# Patient Record
Sex: Male | Born: 1945 | Race: White | Hispanic: No | Marital: Married | Smoking: Former smoker
Health system: Southern US, Community
[De-identification: ages and names within clinical notes are randomized; demographics above are authoritative.]

## PROBLEM LIST (undated history)

## (undated) DIAGNOSIS — I499 Cardiac arrhythmia, unspecified: Secondary | ICD-10-CM

## (undated) DIAGNOSIS — N400 Enlarged prostate without lower urinary tract symptoms: Secondary | ICD-10-CM

## (undated) DIAGNOSIS — I10 Essential (primary) hypertension: Secondary | ICD-10-CM

## (undated) DIAGNOSIS — M069 Rheumatoid arthritis, unspecified: Secondary | ICD-10-CM

## (undated) DIAGNOSIS — K589 Irritable bowel syndrome without diarrhea: Secondary | ICD-10-CM

## (undated) DIAGNOSIS — Z8701 Personal history of pneumonia (recurrent): Secondary | ICD-10-CM

## (undated) DIAGNOSIS — K279 Peptic ulcer, site unspecified, unspecified as acute or chronic, without hemorrhage or perforation: Secondary | ICD-10-CM

## (undated) HISTORY — PX: APPENDECTOMY: SHX54

---

## 2002-02-10 ENCOUNTER — Encounter: Payer: Self-pay | Admitting: Orthopedic Surgery

## 2002-02-10 ENCOUNTER — Ambulatory Visit (HOSPITAL_COMMUNITY): Admission: RE | Admit: 2002-02-10 | Discharge: 2002-02-10 | Payer: Self-pay | Admitting: Orthopedic Surgery

## 2008-07-05 ENCOUNTER — Encounter: Admission: RE | Admit: 2008-07-05 | Discharge: 2008-07-05 | Payer: Self-pay | Admitting: Rheumatology

## 2009-07-21 HISTORY — PX: KNEE ARTHROSCOPY: SUR90

## 2010-04-02 ENCOUNTER — Encounter: Admission: RE | Admit: 2010-04-02 | Discharge: 2010-04-02 | Payer: Self-pay | Admitting: Orthopedic Surgery

## 2010-04-03 ENCOUNTER — Encounter: Admission: RE | Admit: 2010-04-03 | Discharge: 2010-04-03 | Payer: Self-pay | Admitting: Orthopedic Surgery

## 2010-06-20 ENCOUNTER — Ambulatory Visit (HOSPITAL_COMMUNITY)
Admission: RE | Admit: 2010-06-20 | Discharge: 2010-06-20 | Payer: Self-pay | Source: Home / Self Care | Admitting: Rheumatology

## 2010-07-21 HISTORY — PX: TOTAL KNEE ARTHROPLASTY: SHX125

## 2010-08-29 ENCOUNTER — Emergency Department (HOSPITAL_COMMUNITY): Payer: Commercial Managed Care - PPO

## 2010-08-29 ENCOUNTER — Observation Stay (HOSPITAL_COMMUNITY)
Admission: EM | Admit: 2010-08-29 | Discharge: 2010-08-30 | Disposition: A | Discharge status: Other Acute Inpatient Hospital | Payer: Commercial Managed Care - PPO | Attending: Internal Medicine | Admitting: Internal Medicine

## 2010-08-29 ENCOUNTER — Inpatient Hospital Stay (HOSPITAL_COMMUNITY): Payer: Commercial Managed Care - PPO

## 2010-08-29 DIAGNOSIS — Z79899 Other long term (current) drug therapy: Secondary | ICD-10-CM | POA: Insufficient documentation

## 2010-08-29 DIAGNOSIS — M25569 Pain in unspecified knee: Secondary | ICD-10-CM | POA: Insufficient documentation

## 2010-08-29 DIAGNOSIS — T43502A Poisoning by unspecified antipsychotics and neuroleptics, intentional self-harm, initial encounter: Secondary | ICD-10-CM | POA: Insufficient documentation

## 2010-08-29 DIAGNOSIS — T426X1A Poisoning by other antiepileptic and sedative-hypnotic drugs, accidental (unintentional), initial encounter: Secondary | ICD-10-CM | POA: Insufficient documentation

## 2010-08-29 DIAGNOSIS — T426X2A Poisoning by other antiepileptic and sedative-hypnotic drugs, intentional self-harm, initial encounter: Secondary | ICD-10-CM | POA: Insufficient documentation

## 2010-08-29 DIAGNOSIS — T394X2A Poisoning by antirheumatics, not elsewhere classified, intentional self-harm, initial encounter: Secondary | ICD-10-CM | POA: Insufficient documentation

## 2010-08-29 DIAGNOSIS — G8929 Other chronic pain: Secondary | ICD-10-CM | POA: Insufficient documentation

## 2010-08-29 DIAGNOSIS — T4272XA Poisoning by unspecified antiepileptic and sedative-hypnotic drugs, intentional self-harm, initial encounter: Secondary | ICD-10-CM | POA: Insufficient documentation

## 2010-08-29 DIAGNOSIS — T424X4A Poisoning by benzodiazepines, undetermined, initial encounter: Principal | ICD-10-CM | POA: Insufficient documentation

## 2010-08-29 DIAGNOSIS — T50991A Poisoning by other drugs, medicaments and biological substances, accidental (unintentional), initial encounter: Secondary | ICD-10-CM | POA: Insufficient documentation

## 2010-08-29 DIAGNOSIS — T398X1A Poisoning by other nonopioid analgesics and antipyretics, not elsewhere classified, accidental (unintentional), initial encounter: Secondary | ICD-10-CM | POA: Insufficient documentation

## 2010-08-29 DIAGNOSIS — F331 Major depressive disorder, recurrent, moderate: Secondary | ICD-10-CM

## 2010-08-29 DIAGNOSIS — T50992A Poisoning by other drugs, medicaments and biological substances, intentional self-harm, initial encounter: Secondary | ICD-10-CM | POA: Insufficient documentation

## 2010-08-29 DIAGNOSIS — E785 Hyperlipidemia, unspecified: Secondary | ICD-10-CM | POA: Insufficient documentation

## 2010-08-29 DIAGNOSIS — F339 Major depressive disorder, recurrent, unspecified: Secondary | ICD-10-CM | POA: Insufficient documentation

## 2010-08-29 DIAGNOSIS — R404 Transient alteration of awareness: Secondary | ICD-10-CM | POA: Insufficient documentation

## 2010-08-29 DIAGNOSIS — T4271XA Poisoning by unspecified antiepileptic and sedative-hypnotic drugs, accidental (unintentional), initial encounter: Secondary | ICD-10-CM | POA: Insufficient documentation

## 2010-08-29 DIAGNOSIS — M069 Rheumatoid arthritis, unspecified: Secondary | ICD-10-CM | POA: Insufficient documentation

## 2010-08-29 LAB — CBC
Platelets: 207 10*3/uL (ref 150–400)
RBC: 4.55 MIL/uL (ref 4.22–5.81)
WBC: 6.6 10*3/uL (ref 4.0–10.5)

## 2010-08-29 LAB — DIFFERENTIAL
Basophils Absolute: 0 10*3/uL (ref 0.0–0.1)
Basophils Relative: 1 % (ref 0–1)
Eosinophils Absolute: 0.2 10*3/uL (ref 0.0–0.7)
Neutrophils Relative %: 52 % (ref 43–77)

## 2010-08-29 LAB — ACETAMINOPHEN LEVEL: Acetaminophen (Tylenol), Serum: 17.2 ug/mL (ref 10–30)

## 2010-08-29 LAB — BLOOD GAS, ARTERIAL
Bicarbonate: 27.2 mEq/L — ABNORMAL HIGH (ref 20.0–24.0)
Patient temperature: 98.6
pH, Arterial: 7.407 (ref 7.350–7.450)

## 2010-08-29 LAB — RAPID URINE DRUG SCREEN, HOSP PERFORMED
Amphetamines: NOT DETECTED
Cocaine: NOT DETECTED
Tetrahydrocannabinol: NOT DETECTED

## 2010-08-29 LAB — ETHANOL: Alcohol, Ethyl (B): <5 mg/dL (ref 0–10)

## 2010-08-29 LAB — BASIC METABOLIC PANEL
BUN: 16 mg/dL (ref 6–23)
Creatinine, Ser: 1.29 mg/dL (ref 0.4–1.5)
GFR calc non Af Amer: 56 mL/min — ABNORMAL LOW (ref >60)

## 2010-08-29 LAB — URINALYSIS, ROUTINE W REFLEX MICROSCOPIC
Bilirubin Urine: NEGATIVE
Ketones, ur: NEGATIVE mg/dL
Protein, ur: NEGATIVE mg/dL
Urine Glucose, Fasting: NEGATIVE mg/dL

## 2010-08-29 LAB — HEPATIC FUNCTION PANEL
Alkaline Phosphatase: 72 U/L (ref 39–117)
Bilirubin, Direct: 0.1 mg/dL (ref 0.0–0.3)
Indirect Bilirubin: 0.7 mg/dL (ref 0.3–0.9)
Total Bilirubin: 0.8 mg/dL (ref 0.3–1.2)
Total Protein: 6.5 g/dL (ref 6.0–8.3)

## 2010-08-29 LAB — CK TOTAL AND CKMB (NOT AT ARMC)
Relative Index: 2.6 — ABNORMAL HIGH (ref 0.0–2.5)
Total CK: 151 U/L (ref 7–232)

## 2010-08-29 LAB — SALICYLATE LEVEL: Salicylate Lvl: <4 mg/dL (ref 2.8–20.0)

## 2010-08-30 ENCOUNTER — Inpatient Hospital Stay (HOSPITAL_COMMUNITY)
Admission: RE | Admit: 2010-08-30 | Discharge: 2010-09-02 | DRG: 897 | Disposition: A | Discharge status: Home / Self Care | Payer: 59 | Attending: Psychiatry | Admitting: Psychiatry

## 2010-08-30 DIAGNOSIS — Z882 Allergy status to sulfonamides status: Secondary | ICD-10-CM

## 2010-08-30 DIAGNOSIS — R4182 Altered mental status, unspecified: Secondary | ICD-10-CM

## 2010-08-30 DIAGNOSIS — M069 Rheumatoid arthritis, unspecified: Secondary | ICD-10-CM

## 2010-08-30 DIAGNOSIS — F1020 Alcohol dependence, uncomplicated: Principal | ICD-10-CM

## 2010-08-30 DIAGNOSIS — F339 Major depressive disorder, recurrent, unspecified: Secondary | ICD-10-CM

## 2010-08-30 DIAGNOSIS — IMO0002 Reserved for concepts with insufficient information to code with codable children: Secondary | ICD-10-CM

## 2010-08-30 DIAGNOSIS — E785 Hyperlipidemia, unspecified: Secondary | ICD-10-CM

## 2010-08-30 DIAGNOSIS — T50991A Poisoning by other drugs, medicaments and biological substances, accidental (unintentional), initial encounter: Secondary | ICD-10-CM

## 2010-08-30 LAB — CBC
HCT: 42.3 % (ref 39.0–52.0)
MCH: 33.3 pg (ref 26.0–34.0)
MCHC: 33.3 g/dL (ref 30.0–36.0)
MCV: 100 fL (ref 78.0–100.0)
Platelets: 200 10*3/uL (ref 150–400)
RDW: 14.1 % (ref 11.5–15.5)
WBC: 7.4 10*3/uL (ref 4.0–10.5)

## 2010-08-30 LAB — COMPREHENSIVE METABOLIC PANEL
Albumin: 3 g/dL — ABNORMAL LOW (ref 3.5–5.2)
BUN: 12 mg/dL (ref 6–23)
Calcium: 8.5 mg/dL (ref 8.4–10.5)
Creatinine, Ser: 1.04 mg/dL (ref 0.4–1.5)
Glucose, Bld: 86 mg/dL (ref 70–99)
Total Protein: 5.5 g/dL — ABNORMAL LOW (ref 6.0–8.3)

## 2010-08-30 LAB — ACETAMINOPHEN LEVEL: Acetaminophen (Tylenol), Serum: <10 ug/mL — ABNORMAL LOW (ref 10–30)

## 2010-08-30 LAB — SALICYLATE LEVEL: Salicylate Lvl: <4 mg/dL (ref 2.8–20.0)

## 2010-08-31 DIAGNOSIS — F329 Major depressive disorder, single episode, unspecified: Secondary | ICD-10-CM

## 2010-09-01 NOTE — Consult Note (Signed)
NAMEBRIYAN, [name]         ACCOUNT NO.:  [id number]  MEDICAL RECORD NO.:  [id number]           PATIENT TYPE:  I  LOCATION:  1508                         FACILITY:  [facility]  PHYSICIAN:  Eulogio Ditch, MD DATE OF BIRTH:  May 07, 1946  DATE OF CONSULTATION:  08/30/2010 DATE OF DISCHARGE:                                CONSULTATION   REASON FOR CONSULTATION: 1. Suicide attempt. 2. History of depression.  HISTORY OF PRESENT ILLNESS:  A 65 year old white male who was admitted after he overdosed on number of pills of Percocet, Xanax, and Ambien in an attempt to kill himself.  The patient was also given Narcan in the ER.  The patient told me that he just does not want to live in this world because of a number of issues: 1. He is not intimate with wife, he is going through separation. 2. Has issues with the daughter who is 36 years old.  She put the     patient into the jail 1-1/2 years ago and also she has criminal     charges. 3. The patient's house is also for sale.  The patient is very depressed, crying during the interview but is logical and goal directed.  Denies hearing any voices.  He is not delusional.  PAST PSYCHIATRIC HISTORY: 1. The patient denied any past psych hospitalization or any suicide     attempt in the past. 2. He is on Lexapro 10 mg p.o. daily prescribed by a primary care     physician and Xanax, the patient was unable to tell me the dose of     Xanax.  PAST MEDICAL HISTORY: 1. Hyperlipidemia. 2. Rheumatoid arthritis, on chronic prednisone.  ALLERGIES:  Allergic to SULFA which causes headaches.  SUBSTANCE ABUSE HISTORY:  The patient denies use of any recreational drugs, but uses alcohol apparently.  SOCIAL HISTORY:  The patient works in [employer], living with the wife but undergoing through separation.  LABORATORY DATA:  Urine drug screen positive for benzodiazepines and opioids.  Acetaminophen level 17.2, alcohol level less than 7.   CT of the head with no acute findings.  Chest x-ray within normal limits.  MENTAL STATUS EXAM:  Calm, cooperative during the interview.  Mood depressed.  Affect blunted.  Speech soft, slow.  No psychomotor agitation or retardation noted during the interview.  Thought process logical and goal directed.  Thought content, suicidal ideations positive.  The patient attempted overdose with multiple medications, not delusional.  Thought perception, no audiovisual hallucination reported, not internally preoccupied.  Cognition alert, awake, oriented x3. Memory immediate, recent, and remote fair.  Attention and concentration poor.  Abstraction ability fair.  Fund of knowledge fair.  Insight and judgment poor.  DIAGNOSIS:  AXIS I:  Major depressive disorder, recurrent type. AXIS II:  Deferred. AXIS III:  See medical notes. AXIS IV:  Number of psychosocial stressors as stated in history of present illness. AXIS V:  30.  RECOMMENDATIONS: 1. The patient should be transferred to __________ once medically     stable on IVC as the patient refused to come to [facility]. 2. The patient's Lexapro 10 mg p.o. daily can be continued  at this     time. 3. Supportive psychotherapy given to the patient.     Eulogio Ditch, MD     SA/MEDQ  D:  08/30/2010  T:  08/30/2010  Job:  [id number]  Electronically Signed by Eulogio Ditch  on 09/01/2010 01:59:23 PM

## 2010-09-04 NOTE — Discharge Summary (Signed)
  NAMETEREK, BEE             ACCOUNT NO.:  [id number]  MEDICAL RECORD NO.:  [id number]           PATIENT TYPE:  I  LOCATION:  0302                          [facility]ACILITY[facility]  [facility]  PHYSICIAN:  Anselm Jungling, MD  DATE OF BIRTH:  [birth year]  DATE OF ADMISSION:  08/30/2010 DATE OF DISCHARGE:  09/02/2010                              DISCHARGE SUMMARY   IDENTIFYING DATA/REASON FOR ADMISSION:  This was an inpatient psychiatric admission for [name], a 65 year old male, who was admitted to the inpatient psychiatric service for stabilization of depression. Please refer to the admission note for further details pertaining to the symptoms, circumstances and history that led to his hospitalization.  He was given initial Axis I diagnosis of major depressive disorder, recurrent.  He was also given a diagnosis of alcohol dependence NOS.  MEDICAL AND LABORATORY:  The patient was medically and physically assessed by the psychiatric nurse practitioner.  Prior to this, he had been in the medical hospital, where he was initially evaluated by Eulogio Ditch, MD, psychiatrist.  He came to [place] with a history of hyperlipidemia and rheumatoid arthritis.  There were no significant medical issues during his stay.  HOSPITAL COURSE:  The patient was admitted to the adult inpatient psychiatric service.  He was continued on his usual AndroGel, Crestor, Humira injection, hydroxychloroquine, Lexapro, Restasis, and Viagra.  He presented as a well-nourished, normally-developed adult male who was generally pleasant and cooperative.  He attended therapeutic groups and activities geared towards helping him acquire better coping skills, a better understanding of his underlying disorders and dynamics, and the development of a sound aftercare plan.  He was appropriate for discharge on the fourth hospital day.  At that time, he agreed to the following aftercare plan.  AFTERCARE:  The patient was to follow-up at  [facility] on February 15 at 2:30 p.m..  He was also refer to Alcoholics Anonymous meetings and instructed to attend 90 meetings in 90 days.  He was also referred back to Dr. Ree Kida following his discharge.  DISCHARGE MEDICATIONS:  AndroGel 1% 4 pumps daily, Crestor 20 mg daily, Humira injection 200 mg subcutaneous weekly, hydroxychloroquine 200 mg b.i.d., Lexapro 10 mg daily, restasis1 drop in both eyes twice daily, Viagra 100 mg as needed.  DISCHARGE DIAGNOSES:  Axis I:  Alcohol dependence, early remission and depressive disorder not otherwise specified. Axis II:  Deferred. Axis III:  History of hyperlipidemia, hypotestosteronemia, Axis IV:  Stressors severe. Axis V:  Global assessment of functioning on discharge.     Anselm Jungling, MD     SPB/MEDQ  D:  09/03/2010  T:  09/03/2010  Job:  [id number]  Electronically Signed by Geralyn Flash MD on 09/04/2010 09:37:33 AM

## 2010-09-14 NOTE — Discharge Summary (Signed)
  [name], [name]         ACCOUNT NO.:  [id number]  MEDICAL RECORD NO.:  [id number]           PATIENT TYPE:  I  LOCATION:  1508                         FACILITY:  [facility]  PHYSICIAN:  Marcellus Scott, MD     DATE OF BIRTH:  1946/07/15  DATE OF ADMISSION:  08/29/2010 DATE OF DISCHARGE:                        DISCHARGE SUMMARY - REFERRING   ADDENDUM:  PRIMARY CARE PHYSICIAN:  The patient does not have a primary MD.  RHEUMATOLOGIST:  [name], M.D.  The patient indicates that he takes Xanax tablets 0.5 mg tab at either 1/2 or 1 tablet daily as needed for anxiety and not 0.5 mg t.i.d. as initially dictated, so his alprazolam dose will be reduced to 0.5 mg tablet, 1/2 to 1 tablet p.o. daily p.r.n. for anxiety.  Also on the request of the patient, his care was discussed with his spouse who was at the bedside.  On discharge from the [facility], the patient is to call for an appointment to follow up with his rheumatologist, Dr. Pollyann Little.     Marcellus Scott, MD     AH/MEDQ  D:  08/30/2010  T:  08/30/2010  Job:  [id number]  cc:   [name], M.D. Fax: [phone]  Electronically Signed by Marcellus Scott MD on 09/14/2010 09:16:46 PM

## 2010-09-14 NOTE — Discharge Summary (Signed)
[name], [name]         ACCOUNT NO.:  [id number]  MEDICAL RECORD NO.:  [id number]           PATIENT TYPE:  I  LOCATION:  1508                         FACILITY:  [facility]  PHYSICIAN:  Marcellus Scott, MD     DATE OF BIRTH:  Dec 11, 1945  DATE OF ADMISSION:  08/29/2010 DATE OF DISCHARGE:                        DISCHARGE SUMMARY   PRIMARY CARE PHYSICIAN:  The patient does not have a primary care MD and is followed at an urgent care center.  ORTHOPEDIST:  Dyke Brackett, M.D.  DISCHARGE DIAGNOSES: 1. Suicide attempt by polypharmacy overdose. 2. Polypharmacy overdose. 3. History of depression. 4. History of left knee pain. 5. History of rheumatoid arthritis. 6. History of hyperlipidemia.  DISCHARGE MEDICATIONS: 1. Folic acid 1 mg p.o. daily. 2. Thiamine 100 mg p.o. daily. 3. Alprazolam 0.5 mg p.o. t.i.d. 4. AndroGel pump 1%, 4 pumps topically every morning. 5. Crestor 20 mg p.o. daily. 6. Fish oil over-the-counter 1 capsule p.o. daily. 7. Humira injection 200 mg, 1 injection subcutaneously weekly on     Saturdays. Patient to verify dose with her Rheumatologist. 8. Hydroxychloroquine 200 mg p.o. b.i.d. 9. Lexapro 10 mg p.o. daily. 10.Multivitamins 1 tablet p.o. daily. 11.Restasis ophthalmic 1 drop in both eyes b.i.d. 12.Tramadol 50 mg p.o. b.i.d. 13.Viagra 100 mg p.o. daily p.r.n. for erectile dysfunction. 14.Pandel cream 1 application topically daily.  DISCONTINUED MEDICATIONS: 1. Ambien. 2. Hydrocodone acetaminophen.  IMAGING AND PROCEDURES: 1. X-ray of the left knee.  Impression, tricompartmental degenerative     changes with probable small knee joint effusion. 2. CT of the head without contrast.  Impression, no acute findings. 3. Chest x-ray on February 9.  Impression, mild peribronchial     thickening without focal pneumonia.  LABORATORY DATA:  Most recent serum acetaminophen level was less than 10, had a peak of 17, serum salicylate x 3 less than 4.   Comprehensive metabolic panel within normal limits, except for total protein 5.5 and albumin of 3.  CBC within normal limits.  Venous lactate 1.1.  Urine drug screen was positive for benzodiazepines and opiates.  Cardiac enzymes were negative.  Urine analysis did not show features of urinary tract infection.  Blood alcohol level was less than 5.  ABG on admission showed pH of 7.407, pCO2 of 44, pO2 of 69, bicarbonate 27 and oxygen saturation of 94% on 2 liters.  CONSULTATION:  Psychiatry, Eulogio Ditch, MD.  DISCHARGE DIET:  Diet is heart healthy diet.  ACTIVITY:  As tolerated.  HISTORY:  His complaints today none.  The patient denies any knee pain either.  He cannot recollect how many tablets of each medication he overdosed on.  He denies any suicidal or homicidal ideations.  He does not want this MD to contact his orthopedic MD and indicated that he had an appointment yesterday which was cancelled by his wife.  PHYSICAL EXAMINATION:  GENERAL:  The patient is in no obvious distress. He has a Psychologist, educational with him. VITAL SIGNS:  Temperature is 98.2 degrees Fahrenheit, pulse 77 per minute, respirations 20 per minute, blood pressure 108/69 mmHg and saturating at 93% on room air. RESPIRATORY:  Respiratory system is clear. CARDIOVASCULAR:  First  and second heart sounds heard.  Regular rate and rhythm. ABDOMEN:  Nondistended, nontender, soft and bowel sounds present. CENTRAL NERVOUS SYSTEM:  Patient is awake, alert, oriented x 3 with no focal neurological deficits. EXTREMITIES:  With questionable minimal left knee diffuse swelling which is not warm or tender and has no painful range of movements at this time.  Grade 5/5 power in all limbs. PSYCHIATRY:  The patient has a flat affect.  HOSPITAL COURSE:  [name] is a 65 year old Caucasian male patient with history of rheumatoid arthritis, hyperlipidemia, chronic left knee pain which is status post arthroscopic surgery and  has intermittent left knee pain since the surgery in December 2011.  He took an undetermined number of Percocet, Xanax, Ultram and Ambien on the night of February 8, approximately 10 p.m. intentionally because he was in a great deal of financial and emotional stress.  This was with an intention to kill himself.  He was subsequently lethargic, and he also is undergoing marital problems with his wife and legal problems with his 64 year old son.  He was admitted to the hospital for further evaluation and management. 1. Suicide attempt with polypharmacy overdose.  The patient was     admitted to the hospital.  He was placed on one-on-one sitter with     suicide precautions.  He has a flat affect but denies suicidal     ideations at this time.  Psychiatry MD has evaluated him and     recommended transferring him to the psychiatric hospital for     inpatient observation and care once he is medically stable.  At     this time, the patient is medically stable for discharge from     [facility] to the [facility] for further     psychiatric care. 2. Polypharmacy overdose including Percocet, Xanax, Ambien and Ultram.The patient did not respond significantly to Narcan provided in the     ED.  Since then, however, his mental status has improved and he is     awake, alert and coherent.  I discussed with Poison Control this     morning and they indicated that since he did not have any changes     in his transaminases there was no need for any further observation     and testing and he could be transitioned to the next level of care.     Since the patient has been on Xanax chronically, we would continue     with at the same dose to avoid risk of withdrawal seizures.  The     psychiatrist is aware of this.  Also, he will be placed only on the     Ultram for pain management.  He has been counseled regarding not     overdosing on these medications.  He verbalizes understanding.      However, at the [facility] if he is determined to be     at high risk for repeated overdose, then recommend discontinuing     his Ultram and may use ibuprofen for pain management.  At that     time, may also consider tapering and discontinuing his     benzodiazepines. 3. Left knee pain which is probably secondary to osteoarthritis versus     less likely secondary to rheumatoid arthritis.  The patient may     continue his home medications for the rheumatoid arthritis.  He     does not wish this MD to call his  orthopedic MD.  He has been     advised to make a followup appointment with Dr. Madelon Lips on     discharge. 4. Rheumatoid arthritis.  Continue his home medications. 5. Depression.  Management per the psychiatric MD. 6. Skin rash on the left side of the neck which may have been allergic     reaction to an undetermined substance or medication.  This has     resolved.  DISPOSITION:  The patient at this time is medically stable for discharge from the [facility] and transfer to the [facility] for further psychiatric care.  FOLLOWUP RECOMMENDATIONS: 1. With Dr. Madelon Lips.  The patient is to call for an appointment on     discharge from the [facility]. 2. The patient does not have a primary care physician and the social     worker at the [facility] may assist him finding one     or patient can continue to follow up with the urgent care MD, as he     has done in the past.  Time taken in coordinating this discharge is 45 minutes.     Marcellus Scott, MD     AH/MEDQ  D:  08/30/2010  T:  08/30/2010  Job:  [id number]  cc:   Eulogio Ditch, MD & Pollyann Savoy   Electronically Signed by Marcellus Scott MD on 09/14/2010 09:00:57 PM

## 2010-10-02 NOTE — H&P (Signed)
NAMESANDFORD, [name]             ACCOUNT NO.:  [id number]  MEDICAL RECORD NO.:  [id number]           PATIENT TYPE:  E  LOCATION:  WLED                         FACILITY:  [facility]  PHYSICIAN:  Timothy Ranger, MD       DATE OF BIRTH:  [birth year]  DATE OF ADMISSION:  08/29/2010 DATE OF DISCHARGE:                             HISTORY & PHYSICAL   PRIMARY CARE PHYSICIAN:  Unassigned.  CHIEF COMPLAINT:  Altered level of consciousness.  HISTORY OF PRESENT ILLNESS:  [name] is a 65 year old Caucasian male with past medical history of rheumatoid arthritis and hyperlipidemia who presented to [facility] today after wife found him in bed with altered level of consciousness this morning.  Initial report from wife states the patient was taking Percocet, Xanax and Ambien last night in order to relieve left knee pain that is chronic in nature since the patient's arthroscopic knee surgery in December 2011.  After I was able to speak with the patient alone, the patient does admit to intentional overdose.  He states he took "tons" of Percocet, Xanax, Ultram and Ambien.  The patient states he is under a great deal of financial and emotional stress.  The patient's mentation was only slightly improved with Narcan.  He does remain quite lethargic on exam but is able to hold a conversation.  According to the patient, he and his wife are undergoing a separation, however, they continue to live in the same house.  In addition, the patient has had legal problems with his 32 year old son that is causing a great deal of financial stress.  The patient does not wish his hospitalization be discussed with his wife.  PAST MEDICAL HISTORY: 1. Hyperlipidemia. 2. Rheumatoid arthritis, on chronic prednisone. 3. Depression.  PAST SURGICAL HISTORY: 1. Right femur fracture remotely, repaired 2. Left arthroscopic knee surgery by Dr. Madelon Little in December 2011.  CURRENT MEDICATIONS:  Per  medication list per wife: 1. Lexapro 10 mg p.o. daily. 2. Crestor 20 mg p.o. daily. 3. Oxycodone/APAP uncertain dose. 4. Ultram 50 mg p.o. b.i.d. 5. Humira 200 mg p.o. b.i.d. 6. Xanax unknown dose. 7. Ambien 10 mg p.o. q.h.s. p.r.n. insomnia. 8. Prednisone 5 mg p.o. weekly.  ALLERGIES:  SULFA said to cause headaches.  FAMILY HISTORY:  Father deceased at age 79 with coronary artery disease. Mother deceased at age 25 with only known medical history of rheumatoid arthritis.  SOCIAL HISTORY:  The patient currently undergoing separation.  He works for [name].  He is a nonsmoker.  He reports occasional EtOH and no illegal drug use.  REVIEW OF SYSTEMS:  The patient reports left knee pain, chronic in nature; otherwise, review of systems is negative.  PHYSICAL EXAM:  VITAL SIGNS:  Blood pressure 119/80, heart rate 75, respirations 20, temperature afebrile, O2 sat is 98% on room air. GENERAL:  In general, this is a Caucasian male lying supine in stretcher, lethargic, but in no acute distress. HEENT:  Head is normocephalic, atraumatic.  Eyes: Pupils are equal, round, reactive to light.  Extraocular movements are intact.  No scleral icterus or injection.  Ear, nose and throat; mucous  membranes are moist with no oropharyngeal lesions. CARDIOVASCULAR:  S1, S2.  Regular rate and rhythm.  No murmur, rub, or gallop.  No lower extremity edema. RESPIRATORY:  Lung sounds are clear to auscultation bilaterally anteriorly with no increased work of breathing.  No wheezes, rales or crackles. GI:  Abdomen is soft, nontender, nondistended with positive bowel sounds.  No appreciated masses or splenomegaly. NEUROLOGIC:  Neurologically, the patient is slow to form thoughts.  He is able to move all extremities x4 without motor or sensory deficit on exam. PSYCHOLOGIC:  The patient is alert and oriented x4 with affect affected by overdose.  PERTINENT LABS AND RADIOLOGIC STUDIES:  White cell  count 6.6, platelet count 207, hemoglobin 15.6, hematocrit 44.8.  Sodium 141, potassium 4.1, chloride 104, CO2 of 28, BUN 16, creatinine 1.29, serum glucose 77, EtOH is less than 5.  Urine drug screen positive for benzodiazepines and opiates.  Urinalysis is unremarkable.  Acetaminophen level 17.2. Salicylate level less than 4.0.  CK-MB and troponin are negative x1. LFTs within normal limits.  Lactic acid 1.1.  CT of the head with no acute findings.  Chest x-ray with no acute findings.  ASSESSMENT/PLAN: 1. Intentional overdose with suicidal ideation.  We will admit the     patient overnight for observation status to clear medically for     psychiatric treatment.  We will monitor the patient's salicylate     level and LFTs and order for suicide precautions.  Inpatient     psychiatry has been asked to consult next. 2. Depression.  Again, we will ask psychiatry to consult. 3. Right knee pain.  The patient does appear to have some fluid     surrounding knee, however, he is not a good historian at this point     and unable to locate pain or answer any questions regarding nature     of pain.  We will     defer any further workup to rounding MD when the patient is more     alert for focused exam. 4. Rheumatoid arthritis.  We will continue chronic prednisone and     Humira for now. 5. Prophylaxis.  We will order for SCDs for DVT prophylaxis.     Timothy Pen, NP   ______________________________ Timothy Ranger, MD    LE/MEDQ  D:  08/29/2010  T:  08/29/2010  Job:  [id number]  Electronically Signed by Timothy Pen NP on 09/16/2010 09:30:57 AM Electronically Signed by [name]  on 10/02/2010 07:52:22 AM

## 2010-10-21 NOTE — H&P (Signed)
[name], LEWINSKI             ACCOUNT NO.:  [id number]  MEDICAL RECORD NO.:  [id number]           PATIENT TYPE:  I  LOCATION:  0302                          FACILITY:  [facility]  PHYSICIAN:  Anselm Jungling, MD  DATE OF BIRTH:  Oct 23, 1945  DATE OF ADMISSION:  08/30/2010 DATE OF DISCHARGE:                      PSYCHIATRIC ADMISSION ASSESSMENT   This is a voluntary admission to the services of Dr. Geralyn Flash. This is a 65 year old separated white male.  According to the ED record, the patient's wife called EMS after he told her to and he was felt to have overdosed on Ambien, Excedrin PM, Xanax, Percocet, tramadol. The patient denied being suicidal.  He stated he was having pain from his postop left knee arthroscopy. He had taken his meds last night and then again at 3:30 in the morning because he was still in pain.  Apparently his wife felt that he was having altered mental status.  He told his wife that he did not feel well.  He had taken his pain and anxiety meds and when his wife went to get him up, she felt he was altered and called EMS.  He responded partially to Narcan but did not fully wake up and was seen in consultation in the ED by Dr. Rogers Blocker.  Dr. Rogers Blocker conveyed and that the patient had told him that he did not want to live in this world because of a number of issues. 1. He is not intimate with his wife.  They are going through a     separation.  They still live in the same house.  He has issues with     his 51 year old daughter.  He is trying to keep her out of jail.     Also his house is for sale.  The patient was noted to be very     depressed.  He was crying during the interview but he was logical     and goal-directed. He denied any auditory or visual hallucinations     as indeed he persists in this today. He remains labile.  He is easy     to cry. He states he did not sleep well last night.  He is very     fearful of losing his employment.  He states he  has got to get back     on his blackberry and is requesting discharge.  PAST PSYCHIATRIC HISTORY:  He does not have any.  SOCIAL HISTORY:  Although he is married, he is currently separated. They do live in the same house. Their 4 year old son is causing a great deal of legal and financial stress.  MEDICATIONS:  He is currently taking Lexapro 10 mg p.o. daily, Crestor 20 mg p.o. daily, oxycodone/APAP uncertain dose, Ultram 50 mg p.o. b.i.d., Humira 200 mg p.o. b.i.d., Xanax unknown dose, Ambien 10 mg at bedtime p.r.n. insomnia and prednisone 5 mg p.o. weekly.  ALCOHOL AND DRUG HISTORY:  He has social alcohol.  Otherwise he does not have any issues with that.  MEDICAL HISTORY AND PRIMARY CARE PROVIDER:  He sees a Dr. Earlene Plater at [facility], also a Dr. Corliss Skains for rheumatoid  arthritis and Dr. Madelon Lips was his orthopedic. He underwent a left knee arthroscopy in December.  He does have the rheumatoid arthritis and he also takes chronic prednisone and he is known to have hyperlipidemia.DRUG ALLERGIES:  Are to sulfa.  POSITIVE PHYSICAL FINDINGS:  He was medically cleared in the ED at [facility].  His vital signs showed that he was afebrile.  His pulse was 78-102, respirations were 12-20 and his blood pressure was 106/69 to 130/93.  His UDS was positive for opiates and benzos.  He is prescribed. His alcohol level was not measurable.  His CBC had no worrisome abnormalities, nor was his BMET, nor his UA.  MENTAL STATUS EXAM:  He was originally evaluated for altered mental status.  This was due to repeat of his medications. Today he is alert and oriented.  He is appropriately groomed and dressed in his own clothing.  He is easily distressed.  He is very anxious to be able to be on his Romilda Joy for work reasons. His speech is not pressured.  His mood is depressed.  His affect is labile.  He is easy to cry.  His thought processes are clear, rational and goal oriented,  although somewhat superficial and concrete.  Judgment and insight are fair. Concentration and memory superficially are intact.  Intelligence is average.  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 AXIS II:  Deferred. AXIS III:  Hyperlipidemia, rheumatoid arthritis, status post left knee arthroscopy December 2011, status post right femur fracture repaired remotely.  AXIS IV:  Number of psychological stressors: Financial, legal, relationship, etc. AXIS V:  30.  PLAN:  Admit for safety and stabilization.  The patient has fully returned to normal mental status and is requesting discharge. Discharge will be discussed with Dr. Rogers Blocker.     Mickie Leonarda Salon, P.A.-C.   ______________________________ Anselm Jungling, MD    MD/MEDQ  D:  08/31/2010  T:  08/31/2010  Job:  [id number]  Electronically Signed by Jaci Lazier ADAMS P.A.-C. on 10/19/2010 12:30:51 PM Electronically Signed by Geralyn Flash MD on 10/21/2010 11:01:46 AM

## 2010-11-21 ENCOUNTER — Other Ambulatory Visit (HOSPITAL_COMMUNITY): Payer: Self-pay | Admitting: Orthopedic Surgery

## 2010-11-21 ENCOUNTER — Encounter (HOSPITAL_COMMUNITY)
Admission: RE | Admit: 2010-11-21 | Discharge: 2010-11-21 | Disposition: A | Discharge status: Home / Self Care | Payer: Commercial Managed Care - PPO | Source: Ambulatory Visit | Attending: Orthopedic Surgery | Admitting: Orthopedic Surgery

## 2010-11-21 DIAGNOSIS — M1710 Unilateral primary osteoarthritis, unspecified knee: Secondary | ICD-10-CM

## 2010-11-21 LAB — COMPREHENSIVE METABOLIC PANEL
ALT: 22 U/L (ref 0–53)
AST: 23 U/L (ref 0–37)
Calcium: 9.9 mg/dL (ref 8.4–10.5)
Creatinine, Ser: 1.13 mg/dL (ref 0.4–1.5)
GFR calc Af Amer: >60 mL/min (ref >60)
GFR calc non Af Amer: >60 mL/min (ref >60)
Sodium: 136 mEq/L (ref 135–145)
Total Protein: 6.4 g/dL (ref 6.0–8.3)

## 2010-11-21 LAB — DIFFERENTIAL
Basophils Absolute: 0 10*3/uL (ref 0.0–0.1)
Basophils Relative: 1 % (ref 0–1)
Eosinophils Relative: 3 % (ref 0–5)
Lymphocytes Relative: 43 % (ref 12–46)
Monocytes Absolute: 0.7 10*3/uL (ref 0.1–1.0)
Neutro Abs: 1.6 10*3/uL — ABNORMAL LOW (ref 1.7–7.7)

## 2010-11-21 LAB — URINALYSIS, ROUTINE W REFLEX MICROSCOPIC
Nitrite: NEGATIVE
Specific Gravity, Urine: 1.017 (ref 1.005–1.030)
Urobilinogen, UA: 0.2 mg/dL (ref 0.0–1.0)
pH: 5 (ref 5.0–8.0)

## 2010-11-21 LAB — SURGICAL PCR SCREEN: Staphylococcus aureus: NEGATIVE

## 2010-11-21 LAB — CBC
HCT: 41.8 % (ref 39.0–52.0)
MCHC: 34.2 g/dL (ref 30.0–36.0)
RDW: 13.7 % (ref 11.5–15.5)
WBC: 4.4 10*3/uL (ref 4.0–10.5)

## 2010-11-21 LAB — APTT: aPTT: 25 seconds (ref 24–37)

## 2010-11-21 LAB — PROTIME-INR
INR: 0.89 (ref 0.00–1.49)
Prothrombin Time: 12.3 seconds (ref 11.6–15.2)

## 2010-11-22 LAB — URINE CULTURE
Culture  Setup Time: 201205031505
Culture: NO GROWTH

## 2010-11-29 ENCOUNTER — Inpatient Hospital Stay (HOSPITAL_COMMUNITY): Payer: Commercial Managed Care - PPO

## 2010-11-29 ENCOUNTER — Inpatient Hospital Stay (HOSPITAL_COMMUNITY)
Admission: RE | Admit: 2010-11-29 | Discharge: 2010-12-02 | DRG: 470 | Disposition: A | Discharge status: Home Health | Payer: Commercial Managed Care - PPO | Source: Ambulatory Visit | Attending: Orthopedic Surgery | Admitting: Orthopedic Surgery

## 2010-11-29 DIAGNOSIS — M069 Rheumatoid arthritis, unspecified: Principal | ICD-10-CM | POA: Diagnosis present

## 2010-11-29 DIAGNOSIS — D62 Acute posthemorrhagic anemia: Secondary | ICD-10-CM | POA: Diagnosis not present

## 2010-11-29 DIAGNOSIS — Z01818 Encounter for other preprocedural examination: Secondary | ICD-10-CM

## 2010-11-29 DIAGNOSIS — K5090 Crohn's disease, unspecified, without complications: Secondary | ICD-10-CM | POA: Diagnosis present

## 2010-11-29 DIAGNOSIS — M8708 Idiopathic aseptic necrosis of bone, other site: Secondary | ICD-10-CM | POA: Diagnosis present

## 2010-11-29 DIAGNOSIS — Z01812 Encounter for preprocedural laboratory examination: Secondary | ICD-10-CM

## 2010-11-29 DIAGNOSIS — M948X9 Other specified disorders of cartilage, unspecified sites: Secondary | ICD-10-CM | POA: Diagnosis present

## 2010-11-29 DIAGNOSIS — E871 Hypo-osmolality and hyponatremia: Secondary | ICD-10-CM | POA: Diagnosis present

## 2010-11-29 LAB — TYPE AND SCREEN
ABO/RH(D): A POS
Antibody Screen: NEGATIVE

## 2010-11-29 LAB — ABO/RH: ABO/RH(D): A POS

## 2010-11-30 LAB — CBC
HCT: 32.8 % — ABNORMAL LOW (ref 39.0–52.0)
Hemoglobin: 11 g/dL — ABNORMAL LOW (ref 13.0–17.0)
MCHC: 33.5 g/dL (ref 30.0–36.0)
RBC: 3.36 MIL/uL — ABNORMAL LOW (ref 4.22–5.81)

## 2010-11-30 LAB — BASIC METABOLIC PANEL
CO2: 34 mEq/L — ABNORMAL HIGH (ref 19–32)
Chloride: 101 mEq/L (ref 96–112)
GFR calc Af Amer: >60 mL/min (ref >60)
Glucose, Bld: 103 mg/dL — ABNORMAL HIGH (ref 70–99)
Sodium: 138 mEq/L (ref 135–145)

## 2010-12-01 LAB — CBC
HCT: 31.3 % — ABNORMAL LOW (ref 39.0–52.0)
Hemoglobin: 10.6 g/dL — ABNORMAL LOW (ref 13.0–17.0)
MCH: 32.7 pg (ref 26.0–34.0)
MCHC: 33.9 g/dL (ref 30.0–36.0)
MCV: 96.6 fL (ref 78.0–100.0)
RBC: 3.24 MIL/uL — ABNORMAL LOW (ref 4.22–5.81)

## 2010-12-01 LAB — BASIC METABOLIC PANEL
BUN: 11 mg/dL (ref 6–23)
CO2: 33 mEq/L — ABNORMAL HIGH (ref 19–32)
Calcium: 9.3 mg/dL (ref 8.4–10.5)
Chloride: 95 mEq/L — ABNORMAL LOW (ref 96–112)
Creatinine, Ser: 0.9 mg/dL (ref 0.4–1.5)
Glucose, Bld: 117 mg/dL — ABNORMAL HIGH (ref 70–99)

## 2010-12-02 LAB — BASIC METABOLIC PANEL
BUN: 13 mg/dL (ref 6–23)
Calcium: 9.3 mg/dL (ref 8.4–10.5)
Chloride: 96 mEq/L (ref 96–112)
Creatinine, Ser: 0.86 mg/dL (ref 0.4–1.5)

## 2010-12-02 LAB — CBC
MCH: 32.6 pg (ref 26.0–34.0)
MCHC: 33.9 g/dL (ref 30.0–36.0)
MCV: 96.2 fL (ref 78.0–100.0)
Platelets: 211 10*3/uL (ref 150–400)
RBC: 3.16 MIL/uL — ABNORMAL LOW (ref 4.22–5.81)
RDW: 13.4 % (ref 11.5–15.5)

## 2010-12-05 NOTE — Op Note (Signed)
[name], [name] NO.:  [id number]  MEDICAL RECORD NO.:  [id number]           PATIENT TYPE:  LOCATION:                                 FACILITY:  PHYSICIAN:  Dyke Brackett, M.D.    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  Based on these factors, he was thought to be a candidate for total knee replacement on the left side.  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  OPERATION:  Left total knee replacement (DePuy Sigma cemented knee, 10- mm bearing, size 4 tibia, femur, 38-mm three-peg all poly patella, antibiotic-impregnated Palacos and gentamicin cement).  SURGEON:  Dyke Brackett, MD  ASSISTANT:  Laural Benes. Su Hilt, PA  TOURNIQUET TIME:  Approximately 1 hour and 45 minutes.  PROCEDURE:  Sterile prep and drape, exsanguination of the left leg, inflation to 350, straight skin incision, medial parapatellar approach to the knee made.  We did a synovectomy for some moderate rheumatoid synovitis.  Procedure was done with a distal femoral cut first with a 5- degree valgus cut to the left knee, resecting 10 mm of bone.  We then did a tibial cut resecting about 2 mm below the most diseased compartment and eventually did another revision cut as extension and flexion gaps were somewhat tight and did revision tibia cut to resect an additional 2 mm with about 2-degree posterior slope noted.  We did extensive medial releasing to get the knee straightened.  To get the flexion gap balanced with the extension  gap with release of the medial sleeve, we had to actually split the insertion of the pes off the bone and even do some deep collateral ligament releases as well as a release of the semimembranosus off the tibia.  We also resected some bone off some tibial osteophytes and did a complete release of the PCL.  After balancing the extension gap, we sized the femur to be a size 4 followed by tenting the guide with the finishing cuts with 3-5 degrees external rotation for the left femur.  We did the anterior-posterior cut, the chamfer cuts, and then placed a gap balancer spacer block and we did get equal balance of flexion and extension with 10 mm.  Attention was then directed to the tibia.  Size 4 tibia, keel cut for that, placed a trial tibia, then went back to the femur and did the box cut for the femoral prosthesis.  Again, with complete release of the PCL, stripping posteriorly, and placed tibiofemoral trial, resected about 9 mm of bone on the patella for a three-peg patellar trial and checked all parameters with trials in full extension.  Excellent mediolateral stability.  No tendency for bearing to spin out, and there was no significant tightness  in flexion with very symmetric range of motion.  Trials were removed.  Bony surfaces were irrigated.  Due to the patient's immunosuppression on Humira although it was taken off preoperatively, for rheumatoid arthritis, I elected to use antibiotic- impregnated cement with Palacos and with gentamicin.  Cement was allowed to get into the doughy state.  We dried all the surfaces of the bone and then placed the prosthesis tibia followed by femur and patella.  I elected to use a trial tibial bearing to allow [place] to inspect the knee after the cement was hardened.  We removed the trial bearing.  We looked for cement and removed any excess cement, then released the tourniquet. No excessive bleeding was noted from the posterior aspect of the knee. Small  bleeders were coagulated.  Final bearing was placed, we then closed the capsule with #1 Ethibond.  Hemovac drain was placed superolaterally.  Subcutaneous tissues with 2-0 Vicryl and skin with skin clips.  Capsule was impregnated with Marcaine 0.25%, taken to recovery room in stable condition with a lightly compressive sterile dressing above the knee and bulky dressing as well as a knee immobilizer.     Dyke Brackett, M.D.     WDC/MEDQ  D:  11/29/2010  T:  11/29/2010  Job:  [id number]  Electronically Signed by W. Joedy Eickhoff M.D. on 12/05/2010 11:13:17 AM

## 2011-02-11 NOTE — Discharge Summary (Signed)
NAMESAYED, APOSTOL NO.:  [id number]  MEDICAL RECORD NO.:  [id number]  LOCATION:  5002                         FACILITY:  [facility]  PHYSICIAN:  Dyke Brackett, M.D.    DATE OF BIRTH:  [birth year]  DATE OF ADMISSION:  11/29/2010 DATE OF DISCHARGE:  12/02/2010                              DISCHARGE SUMMARY   DIAGNOSIS:  End-stage arthritis, left knee.  PROCEDURE WHILE IN HOSPITAL:  Left total knee arthroplasty.  HISTORY OF PRESENT ILLNESS:  The patient is a 65 year old man who has a long history of rheumatoid arthritis.  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 His pain has been increasing and has not responded to conservative measures and he was felt to be a good candidate for a total knee arthroplasty.  Risks and benefits were discussed with the patient and he wishes to proceed with a total knee after the discussion.  PAST MEDICAL HISTORY:  The patient's history is significant for: 1. Allergies to SULFA. 2. History of hypertension. 3. Crohn disease. 4. Rheumatoid arthritis.  SURGICAL HISTORY:  Appendectomy, right femur fracture ORIF, and arthroscopy.  No difficulties with postoperative course and any other surgeries.  MEDICATIONS AT TIME OF ADMISSION:  Omeprazole, budesonide, alprazolam, Lexapro, tramadol, hydrocodone, lisinopril, Humulin, atropine as well as Plaquenil.  FAMILY HISTORY:  Significant for heart disease, MI, and hypertension.  SOCIAL HISTORY:  Significant for a 30-year pack-and-a half-a-day daily smoking habit, which was discontinued in 2001.  He has 4-5 beers 4-5 days per week.  He does not use any current tobacco or snuff products. He lives with his wife and lives in a two-story residence.  REVIEW OF SYSTEMS:  Positive for glasses, decreased hearing, history of nosebleeds.  PREOPERATIVE PHYSICAL  EXAMINATION:  VITAL SIGNS:  Temperature 97.2, pulse of 82, respirations 16, blood pressure 123/85.  He is 5 feet 10 inches, 190-pound male. HEENT:  Head is atraumatic, normocephalic.  Ears:  TMs are clear.  Eyes: Pupils equal, round, and reactive to light and accommodation.  Nose is patent.  Throat benign. NECK:  Supple.  Full range of motion. CHEST/LUNGS:  Clear to auscultation. CARDIAC:  Regular rate and rhythm.  No murmurs. ABDOMEN:  Soft, nontender. NEUROVASCULAR:  Intact. SKIN:  No obvious breaks in skin. MUSCULOSKELETAL:  His left knee range of motion 5-115 degrees with positive crepitus and stable ligaments, and pain throughout all range of motion.  PREOPERATIVE LABS:  CBC, CMET, chest x-ray, EKG, PT, and PTT were all within normal limits.  HOSPITAL COURSE:  On the day of admission, the patient was taken to the operating room at [facility] where he underwent a left total knee arthroplasty using DePuy Sigma components, a size 4 tibia, size 4 femur, 10-mm bearing, 38-mm three-peg-all-poly patella.  All components cemented with antibiotic-impregnated Palacos and gentamicin cement.  The patient was placed on perioperative antibiotics.  She was placed on postoperative Lovenox prophylaxis, on mechanical prophylaxis for DVT. He was placed on PCA for pain control.  He was given a postoperative physical therapy begun in PACU with CPM as well as postoperative physical therapy  begun on the day of surgery.  Postoperative day #1, the patient was awake and alert.  Vital signs were stable.  Hemoglobin 11.1, neurovascularly intact.  His pain seemed to be difficult to control and had been given significant amount of pain medications to help control that and he continued physical therapy.  PCA was discontinued and Foley was discontinued.  Postoperative day 2, the patient reports pain was much better controlled.  He was tolerating his diet well and had a positive bowel movement.  Hemoglobin  10.6, WBC of 8.4, T-max 100.5, pulse of 82, blood pressure 113/67, and he had an O2 of 99% on room air. Wound was clean and dry.  He did have some drainage at his Hemovac site where it had been discontinued.  Calf was soft, nontender, was tolerating CPM 0-50 degrees and he was otherwise stable.  Incentive spirometer was encouraged due to his increased temperature. Postoperative day 3, he was alert and oriented, vital signs were stable. Hemoglobin 10.3, WBC 7.7.  Wound was clean and dry.  He was neurovascularly intact, tolerating CPM 0-60.  He was making good progress with physical therapy and was safe in transfers as well as short ambulation.  He was discharged home to the care of his family.  He will return to see Dr. Madelon Lips in 10 days' time.  He will continue with his physical therapy at home with home health PT.  Dressing changes daily or as necessary to keep the wound clean, dry, and covered.  He is weightbearing as tolerated with walker.  He will continue using CPM until he is bending 0-90 degrees without difficulty without the CPM's help.  MEDICATIONS AT THE TIME OF DISCHARGE: 1. Tylenol 1-2 by mouth every 4 hours as needed. 2. Cepacol throat lozenges 1 by mouth as needed. 3. Benadryl 25 mg by mouth every 6 hours as needed for itching. 4. Colace 100 mg by mouth twice daily. 5. Lovenox 30 mg subcutaneously twice daily for a total of 10 days     therapy. 6. Vicodin 10/325 one by mouth every 4 hours as needed. 7. Maalox as needed every 4 hours. 8. Continue with TED hose. 9. Robaxin 500 mg by mouth every 6 hours as needed for spasm. 10.Alprazolam 0.5 mg 1 tablet by mouth twice daily as needed. 11.Budesonide 3 mg 1 tablet by mouth 3 times a day. 12.Diphenoxylate atropine 1 tablet as needed daily. 13.Hydroxychloroquine 200 mg by mouth twice daily. 14.Lexapro 10 mg by mouth twice daily. 15.Lisinopril/hydrochlorothiazide 10/12.5 mg 1/2 tablet by mouth     daily. 16.Melatonin over  the counter by mouth daily at bedtime. 17.Omeprazole 40 mg 1 capsule by mouth twice daily. 18.Restasis 1 drop in both eyes daily.  DIET:  Regular.  He will return to see Dr. Madelon Lips sooner within the 10-day schedule time should he have any increase in temperature over 101, pain is not well controlled by pain medication, or drainage from the wound that looks like pus or has a malodor.  At the time of the patient's discharge, he was medically stable and orthopedically improved.     Laural Benes. Jannet Mantis   ______________________________ Dyke Brackett, M.D.    JBR/MEDQ  D:  12/31/2010  T:  01/01/2011  Job:  [id number]  Electronically Signed by Dan Humphreys P.A. on 01/08/2011 08:55:44 PM Electronically Signed by Lacretia Nicks. Shataya Winkles M.D. on 02/11/2011 08:21:02 AM

## 2011-07-22 HISTORY — PX: ABLATION OF DYSRHYTHMIC FOCUS: SHX254

## 2012-06-06 ENCOUNTER — Encounter (HOSPITAL_COMMUNITY): Payer: Self-pay

## 2012-06-06 ENCOUNTER — Inpatient Hospital Stay (HOSPITAL_COMMUNITY)
Admission: EM | Admit: 2012-06-06 | Discharge: 2012-06-07 | DRG: 287 | Disposition: A | Discharge status: Home / Self Care | Payer: Commercial Managed Care - PPO | Attending: Emergency Medicine | Admitting: Emergency Medicine

## 2012-06-06 ENCOUNTER — Emergency Department (HOSPITAL_COMMUNITY): Payer: Commercial Managed Care - PPO

## 2012-06-06 DIAGNOSIS — F1010 Alcohol abuse, uncomplicated: Secondary | ICD-10-CM | POA: Diagnosis present

## 2012-06-06 DIAGNOSIS — Z882 Allergy status to sulfonamides status: Secondary | ICD-10-CM

## 2012-06-06 DIAGNOSIS — I4430 Unspecified atrioventricular block: Secondary | ICD-10-CM | POA: Diagnosis present

## 2012-06-06 DIAGNOSIS — R072 Precordial pain: Secondary | ICD-10-CM

## 2012-06-06 DIAGNOSIS — I4510 Unspecified right bundle-branch block: Secondary | ICD-10-CM | POA: Diagnosis present

## 2012-06-06 DIAGNOSIS — I4892 Unspecified atrial flutter: Principal | ICD-10-CM

## 2012-06-06 DIAGNOSIS — Z8711 Personal history of peptic ulcer disease: Secondary | ICD-10-CM

## 2012-06-06 DIAGNOSIS — Z87891 Personal history of nicotine dependence: Secondary | ICD-10-CM

## 2012-06-06 DIAGNOSIS — M069 Rheumatoid arthritis, unspecified: Secondary | ICD-10-CM | POA: Diagnosis present

## 2012-06-06 DIAGNOSIS — Z79899 Other long term (current) drug therapy: Secondary | ICD-10-CM

## 2012-06-06 DIAGNOSIS — Z7982 Long term (current) use of aspirin: Secondary | ICD-10-CM

## 2012-06-06 DIAGNOSIS — N400 Enlarged prostate without lower urinary tract symptoms: Secondary | ICD-10-CM | POA: Diagnosis present

## 2012-06-06 DIAGNOSIS — I10 Essential (primary) hypertension: Secondary | ICD-10-CM

## 2012-06-06 DIAGNOSIS — Z96659 Presence of unspecified artificial knee joint: Secondary | ICD-10-CM

## 2012-06-06 DIAGNOSIS — I959 Hypotension, unspecified: Secondary | ICD-10-CM

## 2012-06-06 DIAGNOSIS — Z8249 Family history of ischemic heart disease and other diseases of the circulatory system: Secondary | ICD-10-CM

## 2012-06-06 HISTORY — DX: Rheumatoid arthritis, unspecified: M06.9

## 2012-06-06 HISTORY — DX: Peptic ulcer, site unspecified, unspecified as acute or chronic, without hemorrhage or perforation: K27.9

## 2012-06-06 HISTORY — DX: Benign prostatic hyperplasia without lower urinary tract symptoms: N40.0

## 2012-06-06 HISTORY — DX: Irritable bowel syndrome, unspecified: K58.9

## 2012-06-06 HISTORY — DX: Personal history of pneumonia (recurrent): Z87.01

## 2012-06-06 HISTORY — DX: Essential (primary) hypertension: I10

## 2012-06-06 LAB — COMPREHENSIVE METABOLIC PANEL
Albumin: 3.4 g/dL — ABNORMAL LOW (ref 3.5–5.2)
BUN: 22 mg/dL (ref 6–23)
Creatinine, Ser: 1.2 mg/dL (ref 0.50–1.35)
Total Protein: 6.2 g/dL (ref 6.0–8.3)

## 2012-06-06 LAB — HEPATIC FUNCTION PANEL
Alkaline Phosphatase: 57 U/L (ref 39–117)
Bilirubin, Direct: 0.1 mg/dL (ref 0.0–0.3)
Indirect Bilirubin: 0.5 mg/dL (ref 0.3–0.9)
Total Protein: 6.5 g/dL (ref 6.0–8.3)

## 2012-06-06 LAB — PROTIME-INR: Prothrombin Time: 13.1 seconds (ref 11.6–15.2)

## 2012-06-06 LAB — CBC WITH DIFFERENTIAL/PLATELET
Basophils Absolute: 0.1 10*3/uL (ref 0.0–0.1)
Eosinophils Absolute: 0.1 10*3/uL (ref 0.0–0.7)
Eosinophils Relative: 2 % (ref 0–5)
Lymphocytes Relative: 15 % (ref 12–46)
MCV: 87.6 fL (ref 78.0–100.0)
Neutrophils Relative %: 73 % (ref 43–77)
Platelets: 238 10*3/uL (ref 150–400)
RDW: 14.6 % (ref 11.5–15.5)
WBC: 7.4 10*3/uL (ref 4.0–10.5)

## 2012-06-06 LAB — APTT: aPTT: 26 seconds (ref 24–37)

## 2012-06-06 LAB — MRSA PCR SCREENING: MRSA by PCR: POSITIVE — AB

## 2012-06-06 LAB — MAGNESIUM: Magnesium: 1.9 mg/dL (ref 1.5–2.5)

## 2012-06-06 MED ORDER — METOPROLOL TARTRATE 25 MG PO TABS
25.0000 mg | ORAL_TABLET | Freq: Two times a day (BID) | ORAL | Status: DC
  Administered 2012-06-07 (×2): 25 mg via ORAL
  Filled 2012-06-06 (×3): qty 1

## 2012-06-06 MED ORDER — LORAZEPAM 0.5 MG PO TABS: 1.0000 mg | ORAL_TABLET | Freq: Four times a day (QID) | ORAL | Status: DC | PRN

## 2012-06-06 MED ORDER — HYDROXYCHLOROQUINE SULFATE 200 MG PO TABS
200.0000 mg | ORAL_TABLET | Freq: Two times a day (BID) | ORAL | Status: DC
  Administered 2012-06-06 – 2012-06-07 (×2): 200 mg via ORAL
  Filled 2012-06-06 (×3): qty 1

## 2012-06-06 MED ORDER — BUDESONIDE 3 MG PO CP24
9.0000 mg | ORAL_CAPSULE | Freq: Every day | ORAL | Status: DC
  Administered 2012-06-07: 9 mg via ORAL
  Filled 2012-06-06: qty 3

## 2012-06-06 MED ORDER — HEPARIN (PORCINE) IN NACL 100-0.45 UNIT/ML-% IJ SOLN
1650.0000 [IU]/h | INTRAMUSCULAR | Status: DC
  Administered 2012-06-06: 1400 [IU]/h via INTRAVENOUS
  Administered 2012-06-07: 1500 [IU]/h via INTRAVENOUS
  Filled 2012-06-06 (×4): qty 250

## 2012-06-06 MED ORDER — TESTOSTERONE 50 MG/5GM (1%) TD GEL
5.0000 g | Freq: Every day | TRANSDERMAL | Status: DC
  Filled 2012-06-06: qty 5

## 2012-06-06 MED ORDER — TEMAZEPAM 7.5 MG PO CAPS: 7.5000 mg | ORAL_CAPSULE | Freq: Every evening | ORAL | Status: DC | PRN

## 2012-06-06 MED ORDER — DILTIAZEM HCL 100 MG IV SOLR
10.0000 mg/h | Freq: Once | INTRAVENOUS | Status: AC
  Administered 2012-06-06: 20 mg/h via INTRAVENOUS
  Administered 2012-06-06: 10 mg/h via INTRAVENOUS
  Filled 2012-06-06: qty 100

## 2012-06-06 MED ORDER — ALPRAZOLAM 0.25 MG PO TABS
0.5000 mg | ORAL_TABLET | Freq: Three times a day (TID) | ORAL | Status: DC | PRN
  Administered 2012-06-06: 0.5 mg via ORAL
  Filled 2012-06-06: qty 1

## 2012-06-06 MED ORDER — LORAZEPAM 2 MG/ML IJ SOLN: 1.0000 mg | Freq: Four times a day (QID) | INTRAMUSCULAR | Status: DC | PRN

## 2012-06-06 MED ORDER — ADULT MULTIVITAMIN W/MINERALS CH
1.0000 | ORAL_TABLET | Freq: Every day | ORAL | Status: DC
  Administered 2012-06-06 – 2012-06-07 (×2): 1 via ORAL
  Filled 2012-06-06 (×2): qty 1

## 2012-06-06 MED ORDER — TRAMADOL HCL 50 MG PO TABS
100.0000 mg | ORAL_TABLET | Freq: Three times a day (TID) | ORAL | Status: DC | PRN
  Filled 2012-06-06: qty 2

## 2012-06-06 MED ORDER — MUPIROCIN 2 % EX OINT
1.0000 "application " | TOPICAL_OINTMENT | Freq: Two times a day (BID) | CUTANEOUS | Status: DC
  Administered 2012-06-07 (×2): 1 via NASAL
  Filled 2012-06-06: qty 22

## 2012-06-06 MED ORDER — HEPARIN BOLUS VIA INFUSION
4500.0000 [IU] | Freq: Once | INTRAVENOUS | Status: DC
  Filled 2012-06-06: qty 4500

## 2012-06-06 MED ORDER — AZELAIC ACID 15 % EX GEL: 1.0000 "application " | Freq: Two times a day (BID) | CUTANEOUS | Status: DC

## 2012-06-06 MED ORDER — ONDANSETRON HCL 4 MG/2ML IJ SOLN: 4.0000 mg | Freq: Four times a day (QID) | INTRAMUSCULAR | Status: DC | PRN

## 2012-06-06 MED ORDER — ACETAMINOPHEN 325 MG PO TABS
650.0000 mg | ORAL_TABLET | ORAL | Status: DC | PRN
  Administered 2012-06-07: 650 mg via ORAL
  Filled 2012-06-06: qty 2

## 2012-06-06 MED ORDER — DEXTROSE 5 % IV SOLN
10.0000 mg/h | INTRAVENOUS | Status: DC
  Administered 2012-06-06: 20 mg/h via INTRAVENOUS
  Administered 2012-06-07: 10 mg/h via INTRAVENOUS
  Filled 2012-06-06 (×3): qty 100

## 2012-06-06 MED ORDER — SODIUM CHLORIDE 0.9 % IV SOLN: 250.0000 mL | INTRAVENOUS | Status: DC | PRN

## 2012-06-06 MED ORDER — THIAMINE HCL 100 MG/ML IJ SOLN
100.0000 mg | Freq: Every day | INTRAMUSCULAR | Status: DC
  Filled 2012-06-06 (×2): qty 1

## 2012-06-06 MED ORDER — NITROGLYCERIN 0.4 MG SL SUBL: 0.4000 mg | SUBLINGUAL_TABLET | SUBLINGUAL | Status: DC | PRN

## 2012-06-06 MED ORDER — SODIUM CHLORIDE 0.9 % IV BOLUS (SEPSIS)
500.0000 mL | Freq: Once | INTRAVENOUS | Status: AC
  Administered 2012-06-06: 13:00:00 via INTRAVENOUS

## 2012-06-06 MED ORDER — DILTIAZEM HCL 25 MG/5ML IV SOLN
10.0000 mg | Freq: Once | INTRAVENOUS | Status: AC
  Administered 2012-06-06: 10 mg via INTRAVENOUS

## 2012-06-06 MED ORDER — SODIUM CHLORIDE 0.9 % IV SOLN
INTRAVENOUS | Status: DC
  Administered 2012-06-06: 125 mL/h via INTRAVENOUS
  Administered 2012-06-06: 16:00:00 via INTRAVENOUS

## 2012-06-06 MED ORDER — BUDESONIDE 9 MG PO TB24: 1.0000 | ORAL_TABLET | Freq: Every day | ORAL | Status: DC

## 2012-06-06 MED ORDER — CHLORHEXIDINE GLUCONATE CLOTH 2 % EX PADS
6.0000 | MEDICATED_PAD | Freq: Every day | CUTANEOUS | Status: DC
  Administered 2012-06-07: 6 via TOPICAL

## 2012-06-06 MED ORDER — FOLIC ACID 1 MG PO TABS
1.0000 mg | ORAL_TABLET | Freq: Every day | ORAL | Status: DC
  Administered 2012-06-06 – 2012-06-07 (×2): 1 mg via ORAL
  Filled 2012-06-06 (×2): qty 1

## 2012-06-06 MED ORDER — SODIUM CHLORIDE 0.9 % IJ SOLN: 3.0000 mL | INTRAMUSCULAR | Status: DC | PRN

## 2012-06-06 MED ORDER — FESOTERODINE FUMARATE ER 4 MG PO TB24
4.0000 mg | ORAL_TABLET | Freq: Every day | ORAL | Status: DC
  Administered 2012-06-07: 4 mg via ORAL
  Filled 2012-06-06: qty 1

## 2012-06-06 MED ORDER — SODIUM CHLORIDE 0.9 % IJ SOLN
3.0000 mL | Freq: Two times a day (BID) | INTRAMUSCULAR | Status: DC
  Administered 2012-06-07: 10:00:00 via INTRAVENOUS

## 2012-06-06 MED ORDER — ALUM & MAG HYDROXIDE-SIMETH 200-200-20 MG/5ML PO SUSP
15.0000 mL | Freq: Four times a day (QID) | ORAL | Status: DC | PRN
  Administered 2012-06-07: 15 mL via ORAL
  Filled 2012-06-06: qty 30

## 2012-06-06 MED ORDER — DIPHENOXYLATE-ATROPINE 2.5-0.025 MG PO TABS: 1.0000 | ORAL_TABLET | Freq: Four times a day (QID) | ORAL | Status: DC | PRN

## 2012-06-06 MED ORDER — VITAMIN B-1 100 MG PO TABS
100.0000 mg | ORAL_TABLET | Freq: Every day | ORAL | Status: DC
  Administered 2012-06-06 – 2012-06-07 (×2): 100 mg via ORAL
  Filled 2012-06-06 (×2): qty 1

## 2012-06-06 NOTE — H&P (Signed)
History and Physical  Patient ID: [name] MRN: [id number], SOB: [birth year] 66 y.o. Date of Encounter: 06/06/2012, 3:30 PM  Primary Physician: Leanor Rubenstein, MD Primary Cardiologist: new to [facility] Primary Electrophysiologist:  None   Chief Complaint: Chest Pain  History of Present Illness: [name] is a 66 y.o. male with a hx of HTN, PUD, RA, BPH.  He was in his USOH until today around 11:00am when he had sudden onset of substernal chest pressure, dyspnea, weakness and diaphoresis.  He was preparing to play a round of golf at the time.  He decided that he was too weak to play.  He had his friends call 911 and he was transported to [facility] ED by EMS.  En route, his ECG was noted to demonstrate AFlutter with RVR.  Originally, he was hypotensive and there was concern for WCT.  No rate controlling medications were given.  He was given IVF bolus x 500 cc.  In the ED, he was given Diltiazem bolus 10 mg x 2.  He is now on Diltiazem IV gtt at 20 mg/hr.  His HR was initially in the 160s.  It is now in the 100s-1teens.  He feels better with less chest discomfort at this time and is resting comfortably in bed.  He denies any recent hx of palpitations, chest pain, DOE, syncope, orthopnea, PND.  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  He is quite active and plays golf frequently.  He has not had any decreased exercise tolerance.    Past Medical History  Diagnosis Date  . Hypertension   . Rheumatoid arthritis     Dr. Corliss Skains  . BPH (benign prostatic hyperplasia)   . HTN (hypertension)   . History of pneumonia   . IBS (irritable bowel syndrome)   . PUD (peptic ulcer disease)     Dr. Dulce Sellar    Past Surgical History  Procedure Date  . 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 Knee arthroscopy 2011    left     Current Facility-Administered Medications  Medication Dose Route Frequency Provider Last Rate Last Dose  . 0.9 %  sodium chloride  infusion   Intravenous Continuous Hurman Horn, MD 125 mL/hr at 06/06/12 1321 125 mL/hr at 06/06/12 1321  . [COMPLETED] diltiazem (CARDIZEM) 100 mg in dextrose 5 % 100 mL infusion  10 mg/hr Intravenous Once Hurman Horn, MD 15 mL/hr at 06/06/12 1318 15 mg/hr at 06/06/12 1318  . [COMPLETED] diltiazem (CARDIZEM) injection 10 mg  10 mg Intravenous Once Hurman Horn, MD   10 mg at 06/06/12 1253  . [COMPLETED] diltiazem (CARDIZEM) injection 10 mg  10 mg Intravenous Once Hurman Horn, MD   10 mg at 06/06/12 1250  . [COMPLETED] sodium chloride 0.9 % bolus 500 mL  500 mL Intravenous Once Hurman Horn, MD       Current Outpatient Prescriptions  Medication Sig Dispense Refill  . ALPRAZolam (XANAX) 0.5 MG tablet Take 0.5 mg by mouth 3 (three) times daily.      . Aspirin-Acetaminophen-Caffeine (EXCEDRIN PO) Take 2 tablets by mouth daily as needed.      . Azelaic Acid (FINACEA) 15 % cream Apply topically 2 (two) times daily. After skin is thoroughly washed and patted dry, gently but thoroughly massage a thin film of azelaic acid cream into the affected area twice daily, in the morning and evening.      Marland Kitchen  Budesonide (UCERIS) 9 MG TB24 Take 1 tablet by mouth daily.      . diphenoxylate-atropine (LOMOTIL) 2.5-0.025 MG per tablet Take 1 tablet by mouth 4 (four) times daily.      . fesoterodine (TOVIAZ) 4 MG TB24 Take 4 mg by mouth daily.      . hydroxychloroquine (PLAQUENIL) 200 MG tablet Take 200 mg by mouth 2 (two) times daily.      Marland Kitchen lisinopril-hydrochlorothiazide (PRINZIDE,ZESTORETIC) 10-12.5 MG per tablet Take 0.5 tablets by mouth daily.      Marland Kitchen OVER THE COUNTER MEDICATION Take 1 packet by mouth daily. [employer] brand vitamin multi pack      . sildenafil (VIAGRA) 100 MG tablet Take 50 mg by mouth daily as needed. For ED      . tadalafil (CIALIS) 20 MG tablet Take 20 mg by mouth daily as needed. For ED      . temazepam (RESTORIL) 7.5 MG capsule Take 7.5-15 mg by mouth at bedtime.      Marland Kitchen testosterone (ANDROGEL)  50 MG/5GM GEL Place 5 g onto the skin daily. 4 pumps      . traMADol (ULTRAM) 50 MG tablet Take 100 mg by mouth every 8 (eight) hours as needed. For pain        Allergies: Allergies  Allergen Reactions  . Sulfa Antibiotics Other (See Comments)    migraine   Social History:  The patient  reports that he quit smoking about 13 years ago. His smoking use included Cigarettes. He does not have any smokeless tobacco history on file. He reports that he drinks alcohol.  He does admit to drinking several beers a night (3-4). He reports that he does not use illicit drugs.  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 He is separated. He has one daughter.  Family History:   The patient's family history includes CAD in his father and Heart failure in his father.    ROS:  Please see the history of present illness.   He denies fevers, chills, cough, melena, hematochezia, vomiting, diarrhea.  Denies hx of snoring or daytime hypersomnolence.  All other systems reviewed and negative.   Vital Signs: Blood pressure 108/71, pulse 117, temperature 97.9 F (36.6 C), temperature source Oral, resp. rate 16, height 5\' 10"  (1.778 m), weight 180 lb (81.647 kg), SpO2 98.00%.  PHYSICAL EXAM: General:  Well nourished, well developed, in no acute distress HEENT: normal Lymph: no adenopathy Neck: no JVD Endocrine:  No thryomegaly Vascular: No carotid bruits Cardiac:  normal S1, S2; rapid irregular rhythm; no murmur Lungs:  clear to auscultation bilaterally, no wheezing, rhonchi or rales Abd: soft, nontender, no hepatomegaly Ext: no edema Musculoskeletal:  No deformities, BUE and BLE strength normal and equal Skin: warm and dry Neuro:  CNs 2-12 intact, no focal abnormalities noted Psych:  Normal affect   EKG:  AFlutter, HR 165, RBBB, rightward axis, no acute ST changes  Labs:   Lab Results  Component Value Date   WBC 7.4 06/06/2012   HGB 12.6* 06/06/2012   HCT 38.0* 06/06/2012   MCV 87.6  06/06/2012   PLT 238 06/06/2012      Lab 06/06/12 1259  NA 138  K 3.9  CL 102  CO2 22  BUN 22  CREATININE 1.20  CALCIUM 8.7  PROT 6.2  BILITOT 0.4  ALKPHOS 51  ALT 45  AST 66*  GLUCOSE 105*    POC Troponin - i:   0.01  Radiology/Studies:  Dg Chest  Port 1 View   06/06/2012   IMPRESSION: 1.  Low lung volumes with borderline cardiomegaly.   Original Report Authenticated By: Trudie Reed, M.D.      ASSESSMENT AND PLAN:   1. Atrial flutter with rapid ventricular response  -  Variable AV block.  Rate better controlled on Diltiazem gtt.  BP also improved now.  Continue diltiazem gtt.  Add Metoprolol Tartrate 25 mg TID.  Consider Digoxin if BP remains too soft to use beta blocker.         - Check TSH       - Check Echo       - Start IV Heparin.  If he remains in AFlutter, could consider DCCV.  By Hx, his AFlutter likely started today, but will likely need           TEE.       - Looks like typical counterclockwise flutter.  May benefit from EP consult.        - Keep NPO after midnight  2. Substernal chest pain  -  Probably related to rapid rate.  He has some risk factors for CAD.       - Check serial markers.       - Start IV Heparin as noted.       - Check Echo       - If he rules out for MI, will likely need stress perfusion study.  If he rules in for MI, may need to consider cath. 3. Hypotension  -  Improved.  Likely related to rapid rate. 4. Hypertension  -  Hold Lisinopril/HCTZ. 5. Risk Reduction  -  Check Lipids.  6. Alcohol Abuse  -  Patient does admit to frequent consumption of alcohol.  Will place on CIWA protocol as precaution.   Signed,  Tereso Newcomer, PA-C  06/06/2012, 3:30 PM    Patient seen and examined with Tereso Newcomer, PA-C. We discussed all aspects of the encounter. I agree with the assessment and plan as stated above.   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 First troponin negative. CP now resolved with rate control of AFL. Onset of AFL seems to clearly be this am.  I did bedside echo LV and RV function normal.   Will admit for continue rate control with dilt and b-blocker. Anticoagulate with heparin. Cycle cardiac markers. Will have EP see in am for possible ablation. Will need ischemic evaluation as well. If CE negative would do Myoview. If positive would cath.  Cover for risk of ETOH withdrawal (drinks 4-5 beers per night). Denies significant snoring or OSA sx.  Tomorrow is his 66th bday.  Truman Hayward 4:27 PM

## 2012-06-06 NOTE — ED Notes (Signed)
Dr. Fonnie Jarvis at the bedside at this time.

## 2012-06-06 NOTE — ED Notes (Signed)
Radiology at the bedside to perform portable chest x-ray.

## 2012-06-06 NOTE — ED Notes (Signed)
NS 1000 ml infusing via EMS to 16g IV in LFA

## 2012-06-06 NOTE — ED Notes (Signed)
Per [facility], pt was playing golf and started having mid sternal "indegestion" pain sharp in nature and pressure. HR was 190 and unsure if narrow or wide complex. BP 94/60. Given 500 ml bolus NS, 325 mg ASA. 16g LFA. In route pt states, " i feel really tired".  Initial pain rated 8-9/10 now down to 1/10 pressure feeling.

## 2012-06-06 NOTE — ED Notes (Signed)
Cardiology at the bedside.

## 2012-06-06 NOTE — ED Provider Notes (Signed)
History     CSN: [id number]  Arrival date & time 06/06/12  1224   First MD Initiated Contact with Patient 06/06/12 1238      Chief Complaint  Patient presents with  . Chest Pain    (Consider location/radiation/quality/duration/timing/severity/associated sxs/prior treatment) HPI This 66 year old male does not have any history of documented coronary artery disease or atrial fibrillation or atrial flutter in the past was feeling fine today when he was getting ready to start his round of golf when he had sudden onset of rapid irregular palpitations with chest pressure shortness of breath sweats light headedness and generalized weakness without nausea or vomiting. The symptoms have been severe in onset lasting 90 minutes and now much less severe and minimal now.  He received aspirin and 500 mL saline bolus by EMS prior to arrival. He has not had recent fever cough abdominal pain bloody stools or other concerns. He does not have any altered mental status headache or focal neurologic symptoms such as change in speech vision swallowing or understanding or weakness or numbness or incoordination. He was found to have a wide-complex rapid rate irregular rhythm prior to arrival by EMS with pulse rate fluctuating from 140-180. Past Medical History  Diagnosis Date  . Hypertension   . Rheumatoid arthritis     Dr. Corliss Skains  . BPH (benign prostatic hyperplasia)   . HTN (hypertension)   . History of pneumonia   . IBS (irritable bowel syndrome)   . PUD (peptic ulcer disease)     Dr. Dulce Sellar    Past Surgical History  Procedure Date  . 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 Knee arthroscopy 2011    left    Family History  Problem Relation Age of Onset  . CAD Father     MI x 3 (1st 29s)  . Heart failure Father     History  Substance Use Topics  . Smoking status: Former Smoker    Types: Cigarettes    Quit date: 06/07/1999  . Smokeless tobacco: Not on file  . Alcohol Use: Yes       Comment: occasionally      Review of Systems 10 Systems reviewed and are negative for acute change except as noted in the HPI. Allergies  Sulfa antibiotics  Home Medications   Current Outpatient Rx  Name  Route  Sig  Dispense  Refill  . ALPRAZOLAM 0.5 MG PO TABS   Oral   Take 0.5 mg by mouth 3 (three) times daily.         . AZELAIC ACID 15 % EX GEL   Topical   Apply topically 2 (two) times daily. After skin is thoroughly washed and patted dry, gently but thoroughly massage a thin film of azelaic acid cream into the affected area twice daily, in the morning and evening.         . BUDESONIDE 9 MG PO TB24   Oral   Take 1 tablet by mouth daily.         Marland Kitchen DIPHENOXYLATE-ATROPINE 2.5-0.025 MG PO TABS   Oral   Take 1 tablet by mouth 4 (four) times daily.         . FESOTERODINE FUMARATE ER 4 MG PO TB24   Oral   Take 4 mg by mouth daily.         Marland Kitchen HYDROXYCHLOROQUINE SULFATE 200 MG PO TABS   Oral   Take 200 mg by mouth 2 (two) times daily.         Marland Kitchen  LISINOPRIL-HYDROCHLOROTHIAZIDE 10-12.5 MG PO TABS   Oral   Take 0.5 tablets by mouth daily.         Marland Kitchen OVER THE COUNTER MEDICATION   Oral   Take 1 packet by mouth daily. [employer] brand vitamin multi pack         . SILDENAFIL CITRATE 100 MG PO TABS   Oral   Take 50 mg by mouth daily as needed. For ED         . TADALAFIL 20 MG PO TABS   Oral   Take 20 mg by mouth daily as needed. For ED         . TEMAZEPAM 7.5 MG PO CAPS   Oral   Take 7.5-15 mg by mouth at bedtime.         . TESTOSTERONE 50 MG/5GM TD GEL   Transdermal   Place 5 g onto the skin daily. 4 pumps         . TRAMADOL HCL 50 MG PO TABS   Oral   Take 100 mg by mouth every 8 (eight) hours as needed. For pain         . APIXABAN 5 MG PO TABS   Oral   Take 1 tablet (5 mg total) by mouth 2 (two) times daily.   60 tablet   11   . METOPROLOL TARTRATE 25 MG PO TABS   Oral   Take 1 tablet (25 mg total) by mouth 2 (two) times daily.   60  tablet   11     BP 85/69  Pulse 83  Temp 97.8 F (36.6 C) (Oral)  Resp 24  Ht 5\' 10"  (1.778 m)  Wt 195 lb 5.2 oz (88.6 kg)  BMI 28.03 kg/m2  SpO2 95%  Physical Exam  Nursing note and vitals reviewed. Constitutional:       Awake, alert, nontoxic appearance.  HENT:  Head: Atraumatic.  Eyes: Right eye exhibits no discharge. Left eye exhibits no discharge.  Neck: Neck supple.  Cardiovascular:  No murmur heard.      Tachycardic, irregularly irregular  Pulmonary/Chest: Effort normal and breath sounds normal. No respiratory distress. He has no wheezes. He has no rales. He exhibits no tenderness.  Abdominal: Soft. There is no tenderness. There is no rebound.  Musculoskeletal: He exhibits no edema and no tenderness.       Baseline ROM, no obvious new focal weakness.  Neurological: He is alert.       Mental status and motor strength appears baseline for patient and situation.  Skin: No rash noted.  Psychiatric: He has a normal mood and affect.    ED Course  Procedures (including critical care time) ECG: Wide-complex tachycardia, irregularly irregular rhythm, ventricular rate 165, suspected atrial fibrillation versus flutter with right bundle branch block, right bundle branch block, right axis deviation, compared with February 2012 right bundle branch block still present but now has rapid irregular rhythm, no definite STEMI noted initial ECG  ECG: Atrial flutter with ventricular rate 136 with variable block, premature ventricular complexes, incomplete right bundle branch block  Pt improved rate 100-110; d/w Cards and IM, Cards to admit.  CRITICAL CARE Performed by: Hurman Horn   Total critical care time:  Critical care time was exclusive of separately billable procedures and treating other patients.  Critical care was necessary to treat or prevent imminent or life-threatening deterioration.  Critical care was time spent personally by me on the following activities:  development of treatment plan with  patient and/or surrogate as well as nursing, discussions with consultants, evaluation of patient's response to treatment, examination of patient, obtaining history from patient or surrogate, ordering and performing treatments and interventions, ordering and review of laboratory studies, ordering and review of radiographic studies, pulse oximetry and re-evaluation of patient's condition. Labs Reviewed  CBC WITH DIFFERENTIAL - Abnormal; Notable for the following:    Hemoglobin 12.6 (*)     HCT 38.0 (*)     All other components within normal limits  COMPREHENSIVE METABOLIC PANEL - Abnormal; Notable for the following:    Glucose, Bld 105 (*)     Albumin 3.4 (*)     AST 66 (*)     GFR calc non Af Amer 62 (*)     GFR calc Af Amer 72 (*)     All other components within normal limits  TROPONIN I - Abnormal; Notable for the following:    Troponin I 0.38 (*)     All other components within normal limits  PRO B NATRIURETIC PEPTIDE - Abnormal; Notable for the following:    Pro B Natriuretic peptide (BNP) 1031.0 (*)     All other components within normal limits  HEPATIC FUNCTION PANEL - Abnormal; Notable for the following:    AST 58 (*)     All other components within normal limits  MRSA PCR SCREENING - Abnormal; Notable for the following:    MRSA by PCR POSITIVE (*)     All other components within normal limits  CBC - Abnormal; Notable for the following:    Hemoglobin 12.6 (*)     HCT 37.2 (*)     All other components within normal limits  HEPARIN LEVEL (UNFRACTIONATED) - Abnormal; Notable for the following:    Heparin Unfractionated 0.25 (*)     All other components within normal limits  PROTIME-INR  MAGNESIUM  POCT I-STAT TROPONIN I  TROPONIN I  TROPONIN I  APTT  TSH  HEPARIN LEVEL (UNFRACTIONATED)  LIPID PANEL  LAB REPORT - SCANNED   No results found.   1. Atrial flutter with rapid ventricular response   2. Hypotension   3. Substernal chest  pain   4. Hypertension       MDM          Hurman Horn, MD 06/09/12 2311

## 2012-06-06 NOTE — Progress Notes (Signed)
ANTICOAGULATION CONSULT NOTE - Initial Consult  Pharmacy Consult for UFH Indication: aflutter with RVR  Allergies  Allergen Reactions  . Sulfa Antibiotics Other (See Comments)    migraine    Patient Measurements: Height: 5\' 10"  (177.8 cm) Weight: 194 lb 3.6 oz (88.1 kg) IBW/kg (Calculated) : 73  Heparin Dosing Weight: 88kg  Vital Signs: Temp: 97.9 F (36.6 C) (11/17 1235) Temp src: Oral (11/17 1235) BP: 103/70 mmHg (11/17 1715) Pulse Rate: 103  (11/17 1715)  Labs:  Basename 06/06/12 1259  HGB 12.6*  HCT 38.0*  PLT 238  APTT --  LABPROT 13.1  INR 1.00  HEPARINUNFRC --  CREATININE 1.20  CKTOTAL --  CKMB --  TROPONINI --    Estimated Creatinine Clearance: 69.5 ml/min (by C-G formula based on Cr of 1.2).   Medical History: Past Medical History  Diagnosis Date  . Hypertension   . Rheumatoid arthritis     Dr. Corliss Skains  . BPH (benign prostatic hyperplasia)   . HTN (hypertension)   . History of pneumonia   . IBS (irritable bowel syndrome)   . PUD (peptic ulcer disease)     Dr. Dulce Sellar    Medications:  Prescriptions prior to admission  Medication Sig Dispense Refill  . ALPRAZolam (XANAX) 0.5 MG tablet Take 0.5 mg by mouth 3 (three) times daily.      . Aspirin-Acetaminophen-Caffeine (EXCEDRIN PO) Take 2 tablets by mouth daily as needed.      . Azelaic Acid (FINACEA) 15 % cream Apply topically 2 (two) times daily. After skin is thoroughly washed and patted dry, gently but thoroughly massage a thin film of azelaic acid cream into the affected area twice daily, in the morning and evening.      . Budesonide (UCERIS) 9 MG TB24 Take 1 tablet by mouth daily.      . diphenoxylate-atropine (LOMOTIL) 2.5-0.025 MG per tablet Take 1 tablet by mouth 4 (four) times daily.      . fesoterodine (TOVIAZ) 4 MG TB24 Take 4 mg by mouth daily.      . hydroxychloroquine (PLAQUENIL) 200 MG tablet Take 200 mg by mouth 2 (two) times daily.      Marland Kitchen lisinopril-hydrochlorothiazide  (PRINZIDE,ZESTORETIC) 10-12.5 MG per tablet Take 0.5 tablets by mouth daily.      Marland Kitchen OVER THE COUNTER MEDICATION Take 1 packet by mouth daily. [employer] brand vitamin multi pack      . sildenafil (VIAGRA) 100 MG tablet Take 50 mg by mouth daily as needed. For ED      . tadalafil (CIALIS) 20 MG tablet Take 20 mg by mouth daily as needed. For ED      . temazepam (RESTORIL) 7.5 MG capsule Take 7.5-15 mg by mouth at bedtime.      Marland Kitchen testosterone (ANDROGEL) 50 MG/5GM GEL Place 5 g onto the skin daily. 4 pumps      . traMADol (ULTRAM) 50 MG tablet Take 100 mg by mouth every 8 (eight) hours as needed. For pain        Assessment: 66 y/o male patient admitted with chest pain, dyspnea and weakness. Found to be in aflutter with RVR requiring anticoagulation.  Goal of Therapy:  Heparin level 0.3-0.7 units/ml Monitor platelets by anticoagulation protocol: Yes   Plan:  Heparin 4500 unit IV bolus followed by infusion at 1400 units/hr. Check 6 hour heparin level with daily cbc and heparin level.  Verlene Mayer, PharmD, BCPS Pager [phone] 06/06/2012,6:20 PM

## 2012-06-07 ENCOUNTER — Encounter (HOSPITAL_COMMUNITY): Admission: EM | Disposition: A | Discharge status: Home / Self Care | Payer: Self-pay | Source: Home / Self Care

## 2012-06-07 DIAGNOSIS — R079 Chest pain, unspecified: Secondary | ICD-10-CM

## 2012-06-07 DIAGNOSIS — I4892 Unspecified atrial flutter: Secondary | ICD-10-CM

## 2012-06-07 DIAGNOSIS — R072 Precordial pain: Secondary | ICD-10-CM

## 2012-06-07 HISTORY — PX: CARDIOVERSION: SHX1299

## 2012-06-07 HISTORY — PX: LEFT HEART CATHETERIZATION WITH CORONARY ANGIOGRAM: SHX5451

## 2012-06-07 LAB — HEPARIN LEVEL (UNFRACTIONATED)
Heparin Unfractionated: 0.25 IU/mL — ABNORMAL LOW (ref 0.30–0.70)
Heparin Unfractionated: 0.36 IU/mL (ref 0.30–0.70)

## 2012-06-07 LAB — CBC
HCT: 37.2 % — ABNORMAL LOW (ref 39.0–52.0)
Hemoglobin: 12.6 g/dL — ABNORMAL LOW (ref 13.0–17.0)
MCHC: 33.9 g/dL (ref 30.0–36.0)
MCV: 86.9 fL (ref 78.0–100.0)

## 2012-06-07 LAB — TROPONIN I: Troponin I: <0.3 ng/mL (ref <0.30)

## 2012-06-07 LAB — LIPID PANEL
LDL Cholesterol: 74 mg/dL (ref 0–99)
Triglycerides: 53 mg/dL (ref <150)
VLDL: 11 mg/dL (ref 0–40)

## 2012-06-07 SURGERY — LEFT HEART CATHETERIZATION WITH CORONARY ANGIOGRAM
Anesthesia: LOCAL

## 2012-06-07 SURGERY — CARDIOVERSION
Anesthesia: LOCAL | Wound class: Clean

## 2012-06-07 MED ORDER — HEPARIN SODIUM (PORCINE) 1000 UNIT/ML IJ SOLN
INTRAMUSCULAR | Status: AC
  Filled 2012-06-07: qty 1

## 2012-06-07 MED ORDER — MIDAZOLAM HCL 2 MG/2ML IJ SOLN
INTRAMUSCULAR | Status: AC
  Filled 2012-06-07: qty 2

## 2012-06-07 MED ORDER — MIDAZOLAM HCL 2 MG/2ML IJ SOLN
INTRAMUSCULAR | Status: AC
Start: 2012-06-07 — End: 2012-06-07
  Filled 2012-06-07: qty 6

## 2012-06-07 MED ORDER — SODIUM CHLORIDE 0.9 % IJ SOLN
3.0000 mL | Freq: Two times a day (BID) | INTRAMUSCULAR | Status: DC
  Administered 2012-06-07: 3 mL via INTRAVENOUS

## 2012-06-07 MED ORDER — SODIUM CHLORIDE 0.9 % IV SOLN
INTRAVENOUS | Status: DC
  Administered 2012-06-07: 15:00:00 via INTRAVENOUS

## 2012-06-07 MED ORDER — MIDAZOLAM HCL 2 MG/2ML IJ SOLN
INTRAMUSCULAR | Status: AC
  Filled 2012-06-07: qty 4

## 2012-06-07 MED ORDER — NITROGLYCERIN 0.2 MG/ML ON CALL CATH LAB
INTRAVENOUS | Status: AC
  Filled 2012-06-07: qty 1

## 2012-06-07 MED ORDER — APIXABAN 5 MG PO TABS: 5.0000 mg | ORAL_TABLET | Freq: Two times a day (BID) | ORAL | Status: DC

## 2012-06-07 MED ORDER — VERAPAMIL HCL 2.5 MG/ML IV SOLN
INTRAVENOUS | Status: AC
  Filled 2012-06-07: qty 2

## 2012-06-07 MED ORDER — SODIUM CHLORIDE 0.9 % IV SOLN: 250.0000 mL | INTRAVENOUS | Status: DC | PRN

## 2012-06-07 MED ORDER — SODIUM CHLORIDE 0.9 % IJ SOLN: 3.0000 mL | INTRAMUSCULAR | Status: DC | PRN

## 2012-06-07 MED ORDER — METOPROLOL TARTRATE 25 MG PO TABS: 25.0000 mg | ORAL_TABLET | Freq: Two times a day (BID) | ORAL | Status: DC

## 2012-06-07 MED ORDER — ASPIRIN 81 MG PO CHEW
324.0000 mg | CHEWABLE_TABLET | ORAL | Status: AC
  Administered 2012-06-07: 324 mg via ORAL
  Filled 2012-06-07: qty 4

## 2012-06-07 MED ORDER — OFF THE BEAT BOOK
Freq: Once | Status: DC
  Filled 2012-06-07 (×2): qty 1

## 2012-06-07 MED ORDER — HEPARIN (PORCINE) IN NACL 2-0.9 UNIT/ML-% IJ SOLN
INTRAMUSCULAR | Status: AC
  Filled 2012-06-07: qty 1500

## 2012-06-07 MED ORDER — FENTANYL CITRATE 0.05 MG/ML IJ SOLN
INTRAMUSCULAR | Status: AC
  Filled 2012-06-07: qty 2

## 2012-06-07 MED ORDER — LIDOCAINE HCL (PF) 1 % IJ SOLN
INTRAMUSCULAR | Status: AC
  Filled 2012-06-07: qty 30

## 2012-06-07 MED ORDER — OFF THE BEAT BOOK
Freq: Once | Status: DC
  Filled 2012-06-07: qty 1

## 2012-06-07 NOTE — Consult Note (Signed)
ELECTROPHYSIOLOGY CONSULT NOTE  Patient ID: [name] MRN: [id number], DOB/AGE: 1945-08-09   Admit date: 06/06/2012 Date of Consult: 06/07/2012  Primary Physician: Leanor Rubenstein, MD Primary Cardiologist: New to [facility] Reason for Consultation: Atrial flutter  History of Present Illness [name] is a 66 year old man with HTN, PUD and RA who was admitted yesterday with CP, SOB and near syncope, found to have rapid atrial flutter. [name] reports an abrupt onset of "dull" chest pain, SOB, nausea, diaphoresis and dizziness while putting his golf clubs in the back of his golf cart yesterday. He had been taking practice swings just prior to the onset of his symptoms. He states it "seemed like the worst heartburn ever and I thought I was going to pass out." He canceled his golf game and his friend called 911. [name] denies having symptoms similar to this in the past. He denies palpitations or syncope. He has otherwise been feeling like his usual self. On admission his 12-lead ECG shows atrial flutter with RBBB at 165 bpm. He was started on IV diltiazem and IV heparin. His rate has improved and is now 90-110. His CP, SOB and dizziness have resolved with improvement in his rate.  [name] denies any history of CAD, MI, CHF, valvular heart disease, rheumatic fever or thyroid dysfunction. He denies recent illness, fever or chills. He drinks alcohol regularly, beer, wine and/or martini 4 times weekly. He denies illicit drug use. He denies history of CVA/TIA or recent surgery.   Past Medical History Past Medical History  Diagnosis Date  . Hypertension   . Rheumatoid arthritis     Dr. Corliss Skains  . BPH (benign prostatic hyperplasia)   . HTN (hypertension)   . History of pneumonia   . IBS (irritable bowel syndrome)   . PUD (peptic ulcer disease)     Dr. Dulce Sellar    Past Surgical History Past Surgical History  Procedure Date  . Total knee arthroplasty 2012    left total knee    . Knee arthroscopy 2011    left     Allergies/Intolerances Allergies  Allergen Reactions  . Sulfa Antibiotics Other (See Comments)    migraine    Inpatient Medications    . Azelaic Acid  1 application Topical BID  . budesonide  9 mg Oral Daily  . Chlorhexidine Gluconate Cloth  6 each Topical Q0600  . [COMPLETED] diltiazem (CARDIZEM) infusion  10 mg/hr Intravenous Once  . [COMPLETED] diltiazem  10 mg Intravenous Once  . [COMPLETED] diltiazem  10 mg Intravenous Once  . fesoterodine  4 mg Oral Daily  . folic acid  1 mg Oral Daily  . heparin  4,500 Units Intravenous Once  . hydroxychloroquine  200 mg Oral BID  . metoprolol tartrate  25 mg Oral BID  . multivitamin with minerals  1 tablet Oral Daily  . mupirocin ointment  1 application Nasal BID  . off the beat book   Does not apply Once  . [COMPLETED] sodium chloride  500 mL Intravenous Once  . sodium chloride  3 mL Intravenous Q12H  . testosterone  5 g Transdermal Daily  . thiamine  100 mg Oral Daily   Or  . thiamine  100 mg Intravenous Daily  . [DISCONTINUED] Budesonide  1 tablet Oral Daily      . sodium chloride 125 mL/hr at 06/06/12 2000  . diltiazem (CARDIZEM) infusion 10 mg/hr (06/07/12 0700)  . heparin 1,500 Units/hr (06/07/12 1191)    Family  History Family History  Problem Relation Age of Onset  . CAD Father     MI x 3 (1st 29s)  . Heart failure Father      Social History Social History  . Marital Status: Married   Occupational History  . corporate executive and attorney [employer]   Social History Main Topics  . Smoking status: Former Smoker    Types: Cigarettes    Quit date: 06/07/1999  . Smokeless tobacco: Not on file  . Alcohol Use: Yes     Comment: 4x weekly - beer, wine, Martini  . Drug Use: No   Review of Systems General: No chills, fever, night sweats or weight changes  Cardiovascular: +chest pain +SOB  No edema, orthopnea, palpitations, paroxysmal nocturnal  dyspnea Dermatological: No rash, lesions or masses Respiratory: No cough Urologic: No hematuria, dysuria Abdominal: No vomiting, diarrhea, bright red blood per rectum, melena, or hematemesis Neurologic: +weakness +dizziness  No visual changes All other systems reviewed and are otherwise negative except as noted above.  Physical Exam Blood pressure 100/69, pulse 100, temperature 97.2 F (36.2 C), temperature source Oral, resp. rate 21, height 5\' 10"  (1.778 m), weight 194 lb 3.6 oz (88.1 kg), SpO2 97.00%.  General: Well developed, well appearing 66 year old male in no acute distress. HEENT: Normocephalic, atraumatic. EOMs intact. Sclera nonicteric. Oropharynx clear.  Neck: Supple wthout bruits. No JVD. Lungs: Respirations regular and unlabored, CTA bilaterally. No wheezes, rales or rhonchi. Heart: Irregular. S1, S2 present. No murmurs, rub, S3 or S4. Abdomen: Soft, non-tender, non-distended. BS present x 4 quadrants. No hepatosplenomegaly.  Extremities: No clubbing, cyanosis or edema. DP/PT/Radials 2+ and equal bilaterally. Psych: Normal affect. Neuro: Alert and oriented X 3. Moves all extremities spontaneously. Musculoskeletal: No kyphosis. Skin: Intact. Warm and dry. No rashes or petechiae in exposed areas.   Labs  Basename 06/07/12 0530 06/06/12 2358 06/06/12 1813  CKTOTAL -- -- --  CKMB -- -- --  TROPONINI <0.30 <0.30 0.38*   Lab Results  Component Value Date   WBC 7.8 06/07/2012   HGB 12.6* 06/07/2012   HCT 37.2* 06/07/2012   MCV 86.9 06/07/2012   PLT 248 06/07/2012    Lab 06/06/12 1814 06/06/12 1259  NA -- 138  K -- 3.9  CL -- 102  CO2 -- 22  BUN -- 22  CREATININE -- 1.20  CALCIUM -- 8.7  PROT 6.5 --  BILITOT 0.6 --  ALKPHOS 57 --  ALT 43 --  AST 58* --  GLUCOSE -- 105*   No components found with this basename: MAGNESIUM No components found with this basename: POCBNP:3 Lab Results  Component Value Date   CHOL 162 06/07/2012   HDL 77 06/07/2012   LDLCALC  74 06/07/2012   TRIG 53 06/07/2012    Basename 06/06/12 1814  TSH 0.678  T4TOTAL --  T3FREE --  THYROIDAB --    Basename 06/06/12 1259  INR 1.00    Radiology/Studies Dg Chest Port 1 View  06/06/2012  *RADIOLOGY REPORT*  Clinical Data: Chest pain.  PORTABLE CHEST - 1 VIEW  Comparison: 11/21/2010.  Findings: Lung volumes are low.  Transcutaneous defibrillator pads are seen projecting over the mediastinum, slightly limiting assessment.  With this limitation in mind, no definite consolidative airspace disease or pleural effusions are identified. Pulmonary vasculature is normal.  Borderline enlargement of the cardiopericardial silhouette.  IMPRESSION: 1.  Low lung volumes with borderline cardiomegaly.   Original Report Authenticated By: Trudie Reed, M.D.     Echocardiogram  Pending  12-lead ECG from presentation yesterday 06/06/2012 12:33 pm - wide complex tachycardia at 165 bpm, atrial flutter with RBBB; QRS 124 msec 12-lead ECG this AM - atrial flutter with incomplete RBBB at 94 bpm; QRS 112 msec  Telemetry shows persistent atrial flutter, although improved rate control this AM  Assessment and Plan 1. Atrial flutter, symptomatic [name] presents with symptomatic atrial flutter, duration ~24 hours now. His echocardiogram is pending. Treatment options were reviewed with him including medical therapy with rate control and/or AADs and DCCV versus EP study +RF ablation for possible definitive treatment. He will also need an ischemic work-up.   Signed, Rick Duff, PA-C 06/07/2012, 9:52 AM  EP Attending  Patient seen and examined. I have reviewed and amended the note as above. He has typical atrial flutter and an elevated troponin and very classic anginal symptoms with initiation of flutter. He also has a strong family history of CAD. I have recommended catheter ablation and left heart cath, and will try and accomplish both today, schedule permitting.   Leonia Reeves.D.

## 2012-06-07 NOTE — Op Note (Signed)
EP Procedure Note  Procedure - DCCV Indication - atrial flutter Findings - After informed consent obtained. Patient sedated with 12 mg versed and 100 mcg of fentanyl. 200 J of synchronized biphasic energy delivered via the electro-dispersive pad without immediate complication, restoring NSR. Patient will now undergo left heart catheterization.   [name].D.

## 2012-06-07 NOTE — Progress Notes (Signed)
ANTICOAGULATION CONSULT NOTE - Follow Up Consult  Pharmacy Consult for UFH Indication: aflutter with RVR  Allergies  Allergen Reactions  . Sulfa Antibiotics Other (See Comments)    migraine    Patient Measurements: Height: 5\' 10"  (177.8 cm) Weight: 194 lb 3.6 oz (88.1 kg) IBW/kg (Calculated) : 73  Heparin Dosing Weight: 88kg  Vital Signs: Temp: 97.2 F (36.2 C) (11/18 0600) Temp src: Oral (11/18 0600) BP: 100/69 mmHg (11/18 0942) Pulse Rate: 100  (11/18 0942)  Labs:  Basename 06/07/12 0919 06/07/12 0530 06/06/12 2359 06/06/12 2358 06/06/12 1814 06/06/12 1813 06/06/12 1259  HGB -- 12.6* -- -- -- -- 12.6*  HCT -- 37.2* -- -- -- -- 38.0*  PLT -- 248 -- -- -- -- 238  APTT -- -- -- -- 26 -- --  LABPROT -- -- -- -- -- -- 13.1  INR -- -- -- -- -- -- 1.00  HEPARINUNFRC 0.25* -- 0.36 -- -- -- --  CREATININE -- -- -- -- -- -- 1.20  CKTOTAL -- -- -- -- -- -- --  CKMB -- -- -- -- -- -- --  TROPONINI -- <0.30 -- <0.30 -- 0.38* --    Estimated Creatinine Clearance: 68.6 ml/min (by C-G formula based on Cr of 1.2).   Medical History: Past Medical History  Diagnosis Date  . Hypertension   . Rheumatoid arthritis     Dr. Corliss Skains  . BPH (benign prostatic hyperplasia)   . HTN (hypertension)   . History of pneumonia   . IBS (irritable bowel syndrome)   . PUD (peptic ulcer disease)     Dr. Dulce Sellar    Medications:  Prescriptions prior to admission  Medication Sig Dispense Refill  . ALPRAZolam (XANAX) 0.5 MG tablet Take 0.5 mg by mouth 3 (three) times daily.      . Aspirin-Acetaminophen-Caffeine (EXCEDRIN PO) Take 2 tablets by mouth daily as needed.      . Azelaic Acid (FINACEA) 15 % cream Apply topically 2 (two) times daily. After skin is thoroughly washed and patted dry, gently but thoroughly massage a thin film of azelaic acid cream into the affected area twice daily, in the morning and evening.      . Budesonide (UCERIS) 9 MG TB24 Take 1 tablet by mouth daily.      .  diphenoxylate-atropine (LOMOTIL) 2.5-0.025 MG per tablet Take 1 tablet by mouth 4 (four) times daily.      . fesoterodine (TOVIAZ) 4 MG TB24 Take 4 mg by mouth daily.      . hydroxychloroquine (PLAQUENIL) 200 MG tablet Take 200 mg by mouth 2 (two) times daily.      Marland Kitchen lisinopril-hydrochlorothiazide (PRINZIDE,ZESTORETIC) 10-12.5 MG per tablet Take 0.5 tablets by mouth daily.      Marland Kitchen OVER THE COUNTER MEDICATION Take 1 packet by mouth daily. [employer] brand vitamin multi pack      . sildenafil (VIAGRA) 100 MG tablet Take 50 mg by mouth daily as needed. For ED      . tadalafil (CIALIS) 20 MG tablet Take 20 mg by mouth daily as needed. For ED      . temazepam (RESTORIL) 7.5 MG capsule Take 7.5-15 mg by mouth at bedtime.      Marland Kitchen testosterone (ANDROGEL) 50 MG/5GM GEL Place 5 g onto the skin daily. 4 pumps      . traMADol (ULTRAM) 50 MG tablet Take 100 mg by mouth every 8 (eight) hours as needed. For pain        Assessment:  66 yo male patient admitted with chest pain, dyspnea and weakness. Found to be in aflutter with RVR requiring anticoagulation. Heparin level down to 0.25 after rate increase. CBC wnl. No bleeding noted.   Goal of Therapy:  Heparin level 0.3-0.7 units/ml Monitor platelets by anticoagulation protocol: Yes   Plan:  1. Increase IV heparin to 1650 units/hr    2. Heparin level in 6 hours.  Thank you,  Brett Fairy, PharmD 06/07/2012 11:19 AM

## 2012-06-07 NOTE — Progress Notes (Signed)
*  PRELIMINARY RESULTS* Echocardiogram 2D Echocardiogram has been performed.  [name] 06/07/2012, 5:11 PM

## 2012-06-07 NOTE — Progress Notes (Signed)
ANTICOAGULATION CONSULT NOTE - Follow Up Consult  Pharmacy Consult for UFH Indication: aflutter with RVR  Allergies  Allergen Reactions  . Sulfa Antibiotics Other (See Comments)    migraine    Patient Measurements: Height: 5\' 10"  (177.8 cm) Weight: 194 lb 3.6 oz (88.1 kg) IBW/kg (Calculated) : 73  Heparin Dosing Weight: 88kg  Vital Signs: Temp: 97.7 F (36.5 C) (11/18 0000) Temp src: Oral (11/18 0000) BP: 98/66 mmHg (11/18 0035) Pulse Rate: 89  (11/18 0035)  Labs:  Basename 06/06/12 2359 06/06/12 2358 06/06/12 1814 06/06/12 1813 06/06/12 1259  HGB -- -- -- -- 12.6*  HCT -- -- -- -- 38.0*  PLT -- -- -- -- 238  APTT -- -- 26 -- --  LABPROT -- -- -- -- 13.1  INR -- -- -- -- 1.00  HEPARINUNFRC 0.36 -- -- -- --  CREATININE -- -- -- -- 1.20  CKTOTAL -- -- -- -- --  CKMB -- -- -- -- --  TROPONINI -- <0.30 -- 0.38* --    Estimated Creatinine Clearance: 68.6 ml/min (by C-G formula based on Cr of 1.2).   Medical History: Past Medical History  Diagnosis Date  . Hypertension   . Rheumatoid arthritis     Dr. Corliss Skains  . BPH (benign prostatic hyperplasia)   . HTN (hypertension)   . History of pneumonia   . IBS (irritable bowel syndrome)   . PUD (peptic ulcer disease)     Dr. Dulce Sellar    Medications:  Prescriptions prior to admission  Medication Sig Dispense Refill  . ALPRAZolam (XANAX) 0.5 MG tablet Take 0.5 mg by mouth 3 (three) times daily.      . Aspirin-Acetaminophen-Caffeine (EXCEDRIN PO) Take 2 tablets by mouth daily as needed.      . Azelaic Acid (FINACEA) 15 % cream Apply topically 2 (two) times daily. After skin is thoroughly washed and patted dry, gently but thoroughly massage a thin film of azelaic acid cream into the affected area twice daily, in the morning and evening.      . Budesonide (UCERIS) 9 MG TB24 Take 1 tablet by mouth daily.      . diphenoxylate-atropine (LOMOTIL) 2.5-0.025 MG per tablet Take 1 tablet by mouth 4 (four) times daily.      .  fesoterodine (TOVIAZ) 4 MG TB24 Take 4 mg by mouth daily.      . hydroxychloroquine (PLAQUENIL) 200 MG tablet Take 200 mg by mouth 2 (two) times daily.      Marland Kitchen lisinopril-hydrochlorothiazide (PRINZIDE,ZESTORETIC) 10-12.5 MG per tablet Take 0.5 tablets by mouth daily.      Marland Kitchen OVER THE COUNTER MEDICATION Take 1 packet by mouth daily. [employer] brand vitamin multi pack      . sildenafil (VIAGRA) 100 MG tablet Take 50 mg by mouth daily as needed. For ED      . tadalafil (CIALIS) 20 MG tablet Take 20 mg by mouth daily as needed. For ED      . temazepam (RESTORIL) 7.5 MG capsule Take 7.5-15 mg by mouth at bedtime.      Marland Kitchen testosterone (ANDROGEL) 50 MG/5GM GEL Place 5 g onto the skin daily. 4 pumps      . traMADol (ULTRAM) 50 MG tablet Take 100 mg by mouth every 8 (eight) hours as needed. For pain        Assessment: 66 yo male patient admitted with chest pain, dyspnea and weakness. Found to be in aflutter with RVR requiring anticoagulation. Heparin level (0.36) is lower-end of  goal range.  Goal of Therapy:  Heparin level 0.3-0.7 units/ml Monitor platelets by anticoagulation protocol: Yes   Plan:  1. Increase IV heparin to 1500 units/hr to keep at-goal.    2. Heparin level in 6 hours. 3. Daily CBC, heparin level.   Lorre Munroe, PharmD 06/07/2012,1:16 AM

## 2012-06-07 NOTE — Progress Notes (Signed)
Dr Tresa Endo notified of Aflutter rates down to 76-84 B/P 92/66 Cardizem drip presently infusing at 20 mg/hr. Decreased drip to 10 mg/hr per Dr. Tresa Endo. May titrate drip to 5 mg if necessary per B/P

## 2012-06-07 NOTE — CV Procedure (Signed)
   Cardiac Catheterization Procedure Note  Name: [name] MRN: [id number] DOB: 03-05-1946  Procedure: Left Heart Cath, Selective Coronary Angiography, LV angiography  Indication: 66 yo WM presents with atrial flutter with rapid ventricular response associated with chest pain, dyspnea and troponin elevation.   Procedural Details: The right wrist was prepped, draped, and anesthetized with 1% lidocaine. Using the modified Seldinger technique, a 5 French sheath was introduced into the right radial artery. 3 mg of verapamil was administered through the sheath, weight-based unfractionated heparin was administered intravenously. Standard Judkins catheters were used for selective coronary angiography and left ventriculography. Catheter exchanges were performed over an exchange length guidewire. There were no immediate procedural complications. A TR band was used for radial hemostasis at the completion of the procedure.  The patient was transferred to the post catheterization recovery area for further monitoring.  Procedural Findings: Hemodynamics: AO 80/54 mean 66 mmHg LV 77/10  Coronary angiography: Coronary dominance: Left  Left mainstem: Normal  Left anterior descending (LAD): Normal  Left circumflex (LCx): Large dominant vessel. Normal.   Right coronary artery (RCA): small, nondominant. Normal.  Left ventriculography: Left ventricular systolic function is normal, LVEF is estimated at 55-65%, there is no significant mitral regurgitation   Final Conclusions:   1. Normal coronary anatomy 2. Normal LV function  Recommendations: patient will be started on oral anticoagulation with Eliquis 5 mg bid post cardioversion. Follow up with Dr. Ladona Ridgel as outpatient.  Theron Arista [facility] 06/07/2012, 2:58 PM

## 2012-06-07 NOTE — Discharge Summary (Signed)
CARDIOLOGY DISCHARGE SUMMARY   Patient ID: [name] MRN: [id number] DOB/AGE: 1946/01/02 66 y.o.  Admit date: 06/06/2012 Discharge date: 06/07/2012  Primary Discharge Diagnosis:  Atrial Flutter, rapid ventricular response, s/p DCCV Secondary Discharge Diagnosis:   Substernal chest pain  Hypotension -   Hypertension  Consults: Electrophysiology  Procedures: Direct current cardioversion, Left Heart Cath, Selective Coronary Angiography, LV angiography  Hospital Course: [name] is a 66 year old male with no previous cardiac issues. He had sudden onset of chest pain, presyncope and diaphoresis. He came to the emergency room by EMS. Initially, there was concern for wide complex tachycardia. However, he was found to be in atrial flutter with rapid ventricular response. He was hypotensive but responded to fluid resuscitation. He was started on IV diltiazem for rate control. Once his heart rate was better controlled, his chest pain and other symptoms resolved. He was admitted for further evaluation and treatment.  His cardiac enzymes were initially slightly elevated but his last 2 troponins were within normal limits. He remained in atrial flutter overnight. He had been anticoagulated with heparin. An electrophysiology consult was called and he was seen by Dr. Ladona Ridgel. Dr. Ladona Ridgel wished to do an ablation, but because of his chest pain and elevated troponin, coronary artery disease needed to be ruled out. It was clear that his atrial flutter was less than 48 hours duration. Therefore, he was cardioverted and then taken to the Cath Lab.  Cardiac catheterization results are below. His cath was clean and his EF was preserved. He was started on Eliquis for anticoagulation. Post-procedure, [name] is ambulating without chest pain or shortness of breath. His cath site is without hematoma or ecchymosis. Once [name] recovers from sedation, he is considered stable for discharge, to follow  up as an outpatient.  Labs:   Lab Results  Component Value Date   WBC 7.8 06/07/2012   HGB 12.6* 06/07/2012   HCT 37.2* 06/07/2012   MCV 86.9 06/07/2012   PLT 248 06/07/2012     Lab 06/06/12 1814 06/06/12 1259  NA -- 138  K -- 3.9  CL -- 102  CO2 -- 22  BUN -- 22  CREATININE -- 1.20  CALCIUM -- 8.7  PROT 6.5 --  BILITOT 0.6 --  ALKPHOS 57 --  ALT 43 --  AST 58* --  GLUCOSE -- 105*    Basename 06/07/12 0530 06/06/12 2358 06/06/12 1813  CKTOTAL -- -- --  CKMB -- -- --  CKMBINDEX -- -- --  TROPONINI <0.30 <0.30 0.38*   Lipid Panel     Component Value Date/Time   CHOL 162 06/07/2012 0530   TRIG 53 06/07/2012 0530   HDL 77 06/07/2012 0530   CHOLHDL 2.1 06/07/2012 0530   VLDL 11 06/07/2012 0530   LDLCALC 74 06/07/2012 0530    Pro B Natriuretic peptide (BNP)  Date/Time Value Range Status  06/06/2012  6:13 PM 1031.0* 0 - 125 pg/mL Final    Basename 06/06/12 1259  INR 1.00      Radiology: Dg Chest Port 1 View 06/06/2012  *RADIOLOGY REPORT*  Clinical Data: Chest pain.  PORTABLE CHEST - 1 VIEW  Comparison: 11/21/2010.  Findings: Lung volumes are low.  Transcutaneous defibrillator pads are seen projecting over the mediastinum, slightly limiting assessment.  With this limitation in mind, no definite consolidative airspace disease or pleural effusions are identified. Pulmonary vasculature is normal.  Borderline enlargement of the cardiopericardial silhouette.  IMPRESSION: 1.  Low lung volumes with borderline cardiomegaly.  Original Report Authenticated By: Trudie Reed, M.D.    Cardiac Cath: 06/07/2012 Final Conclusions:  1. Normal coronary anatomy  2. Normal LV function  EKG:  07-Jun-2012 07:06:01  [facility] R[facility] RECORD Atrial flutter with variable A-V block with premature ventricular or aberrantly conducted complexes Incomplete right bundle branch block Early repolarization Nonspecific ST abnormality Abnormal ECG 43mm/s 34mm/mV  100Hz  8.0.1 12SL 241 HD CID: 1 Referred by: Unconfirmed Vent. rate 94 BPM PR interval * ms QRS duration 112 ms QT/QTc 376/470 ms P-R-T axes 81 89 41  FOLLOW UP PLANS AND APPOINTMENTS Allergies  Allergen Reactions  . Sulfa Antibiotics Other (See Comments)    migraine     Medication List     As of 06/07/2012  5:13 PM    TAKE these medications         ALPRAZolam 0.5 MG tablet   Commonly known as: XANAX   Take 0.5 mg by mouth 3 (three) times daily.      apixaban 5 MG Tabs tablet   Commonly known as: ELIQUIS   Take 1 tablet (5 mg total) by mouth 2 (two) times daily.      diphenoxylate-atropine 2.5-0.025 MG per tablet   Commonly known as: LOMOTIL   Take 1 tablet by mouth 4 (four) times daily.      EXCEDRIN PO   Take 2 tablets by mouth daily as needed.      FINACEA 15 % cream   Generic drug: Azelaic Acid   Apply topically 2 (two) times daily. After skin is thoroughly washed and patted dry, gently but thoroughly massage a thin film of azelaic acid cream into the affected area twice daily, in the morning and evening.      hydroxychloroquine 200 MG tablet   Commonly known as: PLAQUENIL   Take 200 mg by mouth 2 (two) times daily.      lisinopril-hydrochlorothiazide 10-12.5 MG per tablet   Commonly known as: PRINZIDE,ZESTORETIC   Take 0.5 tablets by mouth daily.      metoprolol tartrate 25 MG tablet   Commonly known as: LOPRESSOR   Take 1 tablet (25 mg total) by mouth 2 (two) times daily.      OVER THE COUNTER MEDICATION   Take 1 packet by mouth daily. [employer] brand vitamin multi pack      sildenafil 100 MG tablet   Commonly known as: VIAGRA   Take 50 mg by mouth daily as needed. For ED      tadalafil 20 MG tablet   Commonly known as: CIALIS   Take 20 mg by mouth daily as needed. For ED      temazepam 7.5 MG capsule   Commonly known as: RESTORIL   Take 7.5-15 mg by mouth at bedtime.      testosterone 50 MG/5GM Gel   Commonly known as: ANDROGEL   Place 5 g onto the  skin daily. 4 pumps      TOVIAZ 4 MG Tb24   Generic drug: fesoterodine   Take 4 mg by mouth daily.      traMADol 50 MG tablet   Commonly known as: ULTRAM   Take 100 mg by mouth every 8 (eight) hours as needed. For pain      UCERIS 9 MG Tb24   Generic drug: Budesonide   Take 1 tablet by mouth daily.          Discharge Orders    Future Appointments: Provider: Department: Dept Phone: Center:   06/30/2012 4:15 PM  Marinus Maw, MD [facility] [place]) [id number]     Follow-up Information    Follow up with Lewayne Bunting, MD. On 06/30/2012. (at 4:15 pm)    Contact information:   [address] [place] [address] [phone]          BRING ALL MEDICATIONS WITH YOU TO FOLLOW UP APPOINTMENTS  Time spent with patient to include physician time: 32 min Signed: Theodore Demark 06/07/2012, 5:13 PM Co-Sign MD

## 2012-06-07 NOTE — Interval H&P Note (Signed)
History and Physical Interval Note:  06/07/2012 2:00 PM  [name]  has presented today for surgery, with the diagnosis of Chest pain  The various methods of treatment have been discussed with the patient and family. After consideration of risks, benefits and other options for treatment, the patient has consented to  Procedure(s) (LRB) with comments: LEFT HEART CATHETERIZATION WITH CORONARY ANGIOGRAM (N/A) as a surgical intervention .  The patient's history has been reviewed, patient examined, no change in status, stable for surgery.  I have reviewed the patient's chart and labs.  Questions were answered to the patient's satisfaction.     Theron Arista [facility] 06/07/2012 2:00 PM

## 2012-06-07 NOTE — Care Management Note (Signed)
    Page 1 of 1   06/07/2012     8:38:41 AM   CARE MANAGEMENT NOTE 06/07/2012  Patient:  [name], [name]   Account Number:  [id number]  Date Initiated:  06/07/2012  Documentation initiated by:  Junius Creamer  Subjective/Objective Assessment:   adm w at fib     Action/Plan:   lives alone, pcp dr vyvyan sun   Anticipated DC Date:     Anticipated DC Plan:        DC Planning Services  CM consult      Choice offered to / List presented to:             Status of service:   Medicare Important Message given?   (If response is "NO", the following Medicare IM given date fields will be blank) Date Medicare IM given:   Date Additional Medicare IM given:    Discharge Disposition:    Per UR Regulation:  Reviewed for med. necessity/level of care/duration of stay  If discussed at Long Length of Stay Meetings, dates discussed:    Comments:  11/18 8:37a debbie Fransisco Messmer rn,bsn [phone]

## 2012-06-08 NOTE — Discharge Summary (Signed)
Patient seen and examined and history reviewed. Agree with above findings and plan. See cath report.  Theron Arista [facility] 06/08/2012 8:57 AM

## 2012-06-30 ENCOUNTER — Ambulatory Visit (INDEPENDENT_AMBULATORY_CARE_PROVIDER_SITE_OTHER): Payer: Commercial Managed Care - PPO | Admitting: Internal Medicine

## 2012-06-30 ENCOUNTER — Encounter: Payer: Self-pay | Admitting: *Deleted

## 2012-06-30 ENCOUNTER — Encounter: Payer: Self-pay | Admitting: Internal Medicine

## 2012-06-30 VITALS — BP 124/74 | HR 93 | Ht 70.0 in | Wt 193.0 lb

## 2012-06-30 DIAGNOSIS — I4892 Unspecified atrial flutter: Secondary | ICD-10-CM

## 2012-06-30 DIAGNOSIS — I10 Essential (primary) hypertension: Secondary | ICD-10-CM

## 2012-06-30 NOTE — Patient Instructions (Signed)

## 2012-06-30 NOTE — Progress Notes (Signed)
HPI [name] returns today for followup. He is a pleasant 66 yo man with a h/o atrial flutter and HTN. He was hospitalized with atrial flutter several weeks ago and underwent DCCV. He was placed on Eliquis. He has done well in the interim. He has not had any additional symptomatic atrial flutter. No palpitations or chest pain. Allergies  Allergen Reactions  . Sulfa Antibiotics Other (See Comments)    migraine     Current Outpatient Prescriptions  Medication Sig Dispense Refill  . ALPRAZolam (XANAX) 0.5 MG tablet Take 0.5 mg by mouth 3 (three) times daily.      . apixaban (ELIQUIS) 5 MG TABS tablet Take 1 tablet (5 mg total) by mouth 2 (two) times daily.  60 tablet  11  . Azelaic Acid (FINACEA) 15 % cream Apply topically 2 (two) times daily. After skin is thoroughly washed and patted dry, gently but thoroughly massage a thin film of azelaic acid cream into the affected area twice daily, in the morning and evening.      . Budesonide (UCERIS) 9 MG TB24 Take 1 tablet by mouth daily.      . diphenoxylate-atropine (LOMOTIL) 2.5-0.025 MG per tablet Take 1 tablet by mouth 4 (four) times daily.      . fesoterodine (TOVIAZ) 4 MG TB24 Take 4 mg by mouth daily.      . hydroxychloroquine (PLAQUENIL) 200 MG tablet Take 200 mg by mouth 2 (two) times daily.      . lisinopril-hydrochlorothiazide (PRINZIDE,ZESTORETIC) 10-12.5 MG per tablet Take 0.5 tablets by mouth daily.      . metoprolol tartrate (LOPRESSOR) 25 MG tablet Take 1 tablet (25 mg total) by mouth 2 (two) times daily.  60 tablet  11  . OVER THE COUNTER MEDICATION Take 1 packet by mouth daily. [employer] brand vitamin multi pack      . sildenafil (VIAGRA) 100 MG tablet Take 50 mg by mouth daily as needed. For ED      . tadalafil (CIALIS) 20 MG tablet Take 20 mg by mouth daily as needed. For ED      . temazepam (RESTORIL) 7.5 MG capsule Take 7.5-15 mg by mouth at bedtime.      . testosterone (ANDROGEL) 50 MG/5GM GEL Place 5 g onto the skin daily. 4 pumps       . traMADol (ULTRAM) 50 MG tablet Take 100 mg by mouth every 8 (eight) hours as needed. For pain         Past Medical History  Diagnosis Date  . Hypertension   . Rheumatoid arthritis     Dr. Deveshwar  . BPH (benign prostatic hyperplasia)   . HTN (hypertension)   . History of pneumonia   . IBS (irritable bowel syndrome)   . PUD (peptic ulcer disease)     Dr. Outlaw    ROS:   All systems reviewed and negative except as noted in the HPI.   Past Surgical History  Procedure Date  . 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 Knee arthroscopy 2011    left     Family History  Problem Relation Age of Onset  . CAD Father     MI x 3 (1st 50s)  . Heart failure Father      History   Social History  . Marital Status: Married    Spouse Name: N/A    Number of Children: 1  . Years of Education: N/A   Occupational History  .   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 Smoking status: Former Smoker    Types: Cigarettes    Quit date: 06/07/1999  . Smokeless tobacco: Not on file  . Alcohol Use: Yes     Comment: occasionally  . Drug Use: No  . Sexually Active: Not on file   Other Topics Concern  . Not on file   Social History Narrative  . No narrative on file     BP 124/74  Pulse 93  Ht 5' 10" (1.778 m)  Wt 193 lb (87.544 kg)  BMI 27.69 kg/m2  SpO2 97%  Physical Exam:  Well appearing 66 yo man, NAD HEENT: Unremarkable Neck:  7 cm JVD, no thyromegally Lungs:  Clear with no wheezes, rales, or rhonchi HEART:  Regular rate rhythm, no murmurs, no rubs, no clicks Abd:  soft, positive bowel sounds, no organomegally, no rebound, no guarding Ext:  2 plus pulses, no edema, no cyanosis, no clubbing Skin:  No rashes no nodules Neuro:  CN II through XII intact, motor grossly intact  EKG NSR  Assess/Plan:  

## 2012-06-30 NOTE — Assessment & Plan Note (Signed)
His blood pressure is controlled. He will maintain a low sodium diet. 

## 2012-06-30 NOTE — Assessment & Plan Note (Signed)
He is currently maintaining NSR. I have discussed the treatment options with the patient. The pro's and con's of both a conservative approach as well as proceeding with ablation were discussed with the patient and wishes to proceed with catheter ablation. He will continue his Eliquis until his ablation.

## 2012-07-01 ENCOUNTER — Other Ambulatory Visit: Payer: Self-pay | Admitting: *Deleted

## 2012-07-05 ENCOUNTER — Telehealth: Payer: Self-pay | Admitting: Internal Medicine

## 2012-07-05 NOTE — Telephone Encounter (Signed)
New problem:    Need an different medication other than metoprolol . C/O side effect is ED problem.

## 2012-07-06 NOTE — Telephone Encounter (Signed)
Spoke with patient after discussing with Dr Ladona Ridgel Will have patient decrease his Metoprolol to 1/2 tablet twice

## 2012-07-12 ENCOUNTER — Other Ambulatory Visit (INDEPENDENT_AMBULATORY_CARE_PROVIDER_SITE_OTHER): Payer: Commercial Managed Care - PPO

## 2012-07-12 DIAGNOSIS — I4892 Unspecified atrial flutter: Secondary | ICD-10-CM

## 2012-07-13 LAB — CBC WITH DIFFERENTIAL/PLATELET
Basophils Absolute: 0 10*3/uL (ref 0.0–0.1)
Eosinophils Relative: 3.1 % (ref 0.0–5.0)
HCT: 43 % (ref 39.0–52.0)
Hemoglobin: 14.2 g/dL (ref 13.0–17.0)
Lymphocytes Relative: 30.1 % (ref 12.0–46.0)
Monocytes Relative: 16.3 % — ABNORMAL HIGH (ref 3.0–12.0)
Platelets: 189 10*3/uL (ref 150.0–400.0)
RDW: 17.1 % — ABNORMAL HIGH (ref 11.5–14.6)
WBC: 6.3 10*3/uL (ref 4.5–10.5)

## 2012-07-13 LAB — BASIC METABOLIC PANEL
CO2: 28 mEq/L (ref 19–32)
Chloride: 101 mEq/L (ref 96–112)
Glucose, Bld: 101 mg/dL — ABNORMAL HIGH (ref 70–99)
Sodium: 136 mEq/L (ref 135–145)

## 2012-07-19 ENCOUNTER — Ambulatory Visit (HOSPITAL_COMMUNITY)
Admission: RE | Admit: 2012-07-19 | Discharge: 2012-07-19 | Disposition: A | Discharge status: Home / Self Care | Payer: Commercial Managed Care - PPO | Source: Ambulatory Visit | Attending: Internal Medicine | Admitting: Internal Medicine

## 2012-07-19 ENCOUNTER — Encounter (HOSPITAL_COMMUNITY)
Admission: RE | Disposition: A | Discharge status: Home / Self Care | Payer: Self-pay | Source: Ambulatory Visit | Attending: Internal Medicine

## 2012-07-19 ENCOUNTER — Encounter (HOSPITAL_COMMUNITY): Payer: Self-pay | Admitting: General Practice

## 2012-07-19 DIAGNOSIS — I4892 Unspecified atrial flutter: Secondary | ICD-10-CM | POA: Insufficient documentation

## 2012-07-19 DIAGNOSIS — Z79899 Other long term (current) drug therapy: Secondary | ICD-10-CM | POA: Insufficient documentation

## 2012-07-19 DIAGNOSIS — R072 Precordial pain: Secondary | ICD-10-CM

## 2012-07-19 DIAGNOSIS — Z7901 Long term (current) use of anticoagulants: Secondary | ICD-10-CM | POA: Insufficient documentation

## 2012-07-19 DIAGNOSIS — N400 Enlarged prostate without lower urinary tract symptoms: Secondary | ICD-10-CM | POA: Insufficient documentation

## 2012-07-19 DIAGNOSIS — I10 Essential (primary) hypertension: Secondary | ICD-10-CM | POA: Insufficient documentation

## 2012-07-19 DIAGNOSIS — I959 Hypotension, unspecified: Secondary | ICD-10-CM

## 2012-07-19 DIAGNOSIS — K589 Irritable bowel syndrome without diarrhea: Secondary | ICD-10-CM | POA: Insufficient documentation

## 2012-07-19 DIAGNOSIS — M069 Rheumatoid arthritis, unspecified: Secondary | ICD-10-CM | POA: Insufficient documentation

## 2012-07-19 HISTORY — DX: Cardiac arrhythmia, unspecified: I49.9

## 2012-07-19 HISTORY — PX: ATRIAL FLUTTER ABLATION: SHX5733

## 2012-07-19 SURGERY — ATRIAL FLUTTER ABLATION
Anesthesia: LOCAL

## 2012-07-19 MED ORDER — ACETAMINOPHEN 325 MG PO TABS
650.0000 mg | ORAL_TABLET | ORAL | Status: DC | PRN
  Administered 2012-07-19: 16:00:00 650 mg via ORAL
  Filled 2012-07-19: qty 2

## 2012-07-19 MED ORDER — FENTANYL CITRATE 0.05 MG/ML IJ SOLN
INTRAMUSCULAR | Status: AC
  Filled 2012-07-19: qty 2

## 2012-07-19 MED ORDER — TEMAZEPAM 7.5 MG PO CAPS: 7.5000 mg | ORAL_CAPSULE | Freq: Every day | ORAL | Status: DC

## 2012-07-19 MED ORDER — APIXABAN 5 MG PO TABS
5.0000 mg | ORAL_TABLET | Freq: Two times a day (BID) | ORAL | Status: DC
  Filled 2012-07-19 (×2): qty 1

## 2012-07-19 MED ORDER — FESOTERODINE FUMARATE ER 4 MG PO TB24
4.0000 mg | ORAL_TABLET | Freq: Every day | ORAL | Status: DC
  Administered 2012-07-19: 16:00:00 4 mg via ORAL
  Filled 2012-07-19: qty 1

## 2012-07-19 MED ORDER — TRAMADOL HCL 50 MG PO TABS: 100.0000 mg | ORAL_TABLET | Freq: Three times a day (TID) | ORAL | Status: DC | PRN

## 2012-07-19 MED ORDER — SODIUM CHLORIDE 0.9 % IV SOLN: 250.0000 mL | INTRAVENOUS | Status: DC | PRN

## 2012-07-19 MED ORDER — HEPARIN (PORCINE) IN NACL 2-0.9 UNIT/ML-% IJ SOLN
INTRAMUSCULAR | Status: AC
  Filled 2012-07-19: qty 500

## 2012-07-19 MED ORDER — MIDAZOLAM HCL 5 MG/5ML IJ SOLN
INTRAMUSCULAR | Status: AC
  Filled 2012-07-19: qty 5

## 2012-07-19 MED ORDER — HYDROCHLOROTHIAZIDE 10 MG/ML ORAL SUSPENSION
6.2500 mg | Freq: Every day | ORAL | Status: DC
  Filled 2012-07-19: qty 1.25

## 2012-07-19 MED ORDER — DIPHENOXYLATE-ATROPINE 2.5-0.025 MG PO TABS: 1.0000 | ORAL_TABLET | Freq: Four times a day (QID) | ORAL | Status: DC

## 2012-07-19 MED ORDER — LISINOPRIL-HYDROCHLOROTHIAZIDE 10-12.5 MG PO TABS: 0.5000 | ORAL_TABLET | Freq: Every day | ORAL | Status: DC

## 2012-07-19 MED ORDER — LISINOPRIL 5 MG PO TABS
5.0000 mg | ORAL_TABLET | Freq: Every day | ORAL | Status: DC
  Filled 2012-07-19: qty 1

## 2012-07-19 MED ORDER — BUPIVACAINE HCL (PF) 0.25 % IJ SOLN
INTRAMUSCULAR | Status: AC
  Filled 2012-07-19: qty 60

## 2012-07-19 MED ORDER — ONDANSETRON HCL 4 MG/2ML IJ SOLN: 4.0000 mg | Freq: Four times a day (QID) | INTRAMUSCULAR | Status: DC | PRN

## 2012-07-19 MED ORDER — HYDROXYCHLOROQUINE SULFATE 200 MG PO TABS
200.0000 mg | ORAL_TABLET | Freq: Two times a day (BID) | ORAL | Status: DC
  Administered 2012-07-19: 16:00:00 200 mg via ORAL
  Filled 2012-07-19 (×2): qty 1

## 2012-07-19 MED ORDER — ALPRAZOLAM 0.25 MG PO TABS
0.5000 mg | ORAL_TABLET | Freq: Three times a day (TID) | ORAL | Status: DC
  Filled 2012-07-19: qty 2

## 2012-07-19 MED ORDER — SODIUM CHLORIDE 0.9 % IJ SOLN: 3.0000 mL | Freq: Two times a day (BID) | INTRAMUSCULAR | Status: DC

## 2012-07-19 MED ORDER — METOPROLOL TARTRATE 25 MG PO TABS
25.0000 mg | ORAL_TABLET | Freq: Two times a day (BID) | ORAL | Status: DC
  Administered 2012-07-19: 16:00:00 25 mg via ORAL
  Filled 2012-07-19: qty 1

## 2012-07-19 MED ORDER — SODIUM CHLORIDE 0.9 % IJ SOLN: 3.0000 mL | INTRAMUSCULAR | Status: DC | PRN

## 2012-07-19 NOTE — Discharge Summary (Signed)
ELECTROPHYSIOLOGY DISCHARGE SUMMARY    Patient ID: [name],  MRN: [id number], DOB/AGE: January 12, 1946 66 y.o.  Admit date: 07/19/2012 Discharge date: 07/19/2012  Primary Care Physician: Deatra James, MD Primary Cardiologist: Ladona Ridgel, MD  Primary Discharge Diagnosis:  1. Atrial flutter s/p EP study +RF ablation of atrial flutter  Secondary Discharge Diagnoses:  1. Normal coronary arteries s/p recent cardiac cath 05/2012 2. Normal LV function 3. HTN 4. Rheumatoid arthritis 5. Peptic ulcer disease 6. BPH 7. Irritable bowel syndrome  Procedures This Admission:  1. Comprehensive EP study +RF ablation of atrial flutter without immediate complication (detailed operative note dictated but pending at the time of this summary)  History and Hospital Course:  [name] is a 66 year old gentleman with recently diagnosed, symptomatic atrial flutter. He was hospitalized several weeks ago with symptomatic rapid atrial flutter. He was found to have normal coronary arteries by cath and his echo showed normal LV function. He underwent DCCV and was started on Eliquis. He was discharged home to follow-up with Dr. Ladona Ridgel on 06/30/2012. At that visit, treatment options were reviewed and [name] elected to proceed with definitive therapy. He presented 07/19/2012 for EP study +RF ablation of atrial flutter. He tolerated this procedure well without any immediate complication. He remains hemodynamically stable and afebrile. His groin site is intact without significant bleeding or hematoma. He has been given discharge instructions including wound care and activity restrictions. He will follow-up in clinic in 6 weeks. He has been seen, examined and deemed stable for discharge today by Dr. Lewayne Bunting.   Discharge Vitals: Blood pressure 117/69, pulse 87, temperature 99.1 F (37.3 C), temperature source Oral, resp. rate 14, height 5\' 10"  (1.778 m), weight 185 lb (83.915 kg), SpO2 97.00%.   Labs: Lab  Results  Component Value Date   WBC 6.3 07/12/2012   HGB 14.2 07/12/2012   HCT 43.0 07/12/2012   MCV 89.7 07/12/2012   PLT 189.0 07/12/2012     Lab 07/12/12 1644  NA 136  K 4.2  CL 101  CO2 28  BUN 20  CREATININE 1.2  CALCIUM 9.4  PROT --  BILITOT --  ALKPHOS --  ALT --  AST --  GLUCOSE 101*    Disposition:  The patient is being discharged in stable condition.  Follow-up:     Follow-up Information    Follow up with Lewayne Bunting, MD. On 08/24/2012. (At 11:30 AM)    Contact information:   [address] [place] [address] [phone]       Discharge Medications:    Medication List     As of 07/19/2012  4:30 PM    TAKE these medications         ALPRAZolam 0.5 MG tablet   Commonly known as: XANAX   Take 0.5 mg by mouth 3 (three) times daily.      apixaban 5 MG Tabs tablet   Commonly known as: ELIQUIS   Take 1 tablet (5 mg total) by mouth 2 (two) times daily.      diphenoxylate-atropine 2.5-0.025 MG per tablet   Commonly known as: LOMOTIL   Take 1 tablet by mouth 4 (four) times daily.      FINACEA 15 % cream   Generic drug: Azelaic Acid   Apply topically 2 (two) times daily. After skin is thoroughly washed and patted dry, gently but thoroughly massage a thin film of azelaic acid cream into the affected area twice daily, in the morning  and evening.      hydroxychloroquine 200 MG tablet   Commonly known as: PLAQUENIL   Take 200 mg by mouth 2 (two) times daily.      lisinopril-hydrochlorothiazide 10-12.5 MG per tablet   Commonly known as: PRINZIDE,ZESTORETIC   Take 0.5 tablets by mouth daily.      metoprolol tartrate 25 MG tablet   Commonly known as: LOPRESSOR   Take 1 tablet (25 mg total) by mouth 2 (two) times daily.      OVER THE COUNTER MEDICATION   Take 1 packet by mouth daily. [employer] brand vitamin multi pack      sildenafil 100 MG tablet   Commonly known as: VIAGRA   Take 50 mg by mouth daily as needed. For ED       tadalafil 20 MG tablet   Commonly known as: CIALIS   Take 20 mg by mouth daily as needed. For ED      temazepam 7.5 MG capsule   Commonly known as: RESTORIL   Take 7.5-15 mg by mouth at bedtime.      testosterone 50 MG/5GM Gel   Commonly known as: ANDROGEL   Place 5 g onto the skin daily. 4 pumps      TOVIAZ 4 MG Tb24   Generic drug: fesoterodine   Take 4 mg by mouth daily.      traMADol 50 MG tablet   Commonly known as: ULTRAM   Take 100 mg by mouth every 8 (eight) hours as needed. For pain      UCERIS 9 MG Tb24   Generic drug: Budesonide   Take 1 tablet by mouth daily.       Duration of Discharge Encounter: Greater than 30 minutes including physician time.  Signed, Rick Duff, PA-C 07/19/2012, 4:30 PM

## 2012-07-19 NOTE — Op Note (Signed)
NAMEGERMANY, CHELF NO.:  [id number]  MEDICAL RECORD NO.:  [id number]  LOCATION:  6524                         FACILITY:  [facility]  PHYSICIAN:  Doylene Canning. Ladona Ridgel, MD    DATE OF BIRTH:  [birth year]  DATE OF PROCEDURE:  07/19/2012 DATE OF DISCHARGE:  07/19/2012                              OPERATIVE REPORT   PROCEDURE PERFORMED:  Electrophysiologic study and RF catheter ablation of atrial flutter.  INTRODUCTION:  The patient is a very pleasant 66 year old man with history of atrial flutter status post cardioversion in the past.  The patient desires not to be on anticoagulation or medications for his atrial flutter and is now referred for catheter ablation of atrial flutter.  PROCEDURE:  After informed consent was obtained, the patient was taken to the diagnostic EP lab in a fasting state.  After usual preparation and draping, intravenous fentanyl and midazolam was given for sedation. A 6-French hexapolar catheter was inserted percutaneously into the right jugular vein and advanced to the coronary sinus.  A 6-French quadripolar catheter was inserted percutaneously into the right femoral vein and advanced to the His bundle region.  After measurement of the basic intervals, rapid atrial pacing was carried out from the coronary sinus with a pacing cycle length of 600 milliseconds and stepwise decrease down to 270 milliseconds where AV Wenckebach was observed.  Programmed atrial stimulation was carried out from the coronary sinus and the right atrium at base drive cycle length of 161 milliseconds.  The S1-S2 interval stepwise decreased down to 220 milliseconds where the AV node ERP was observed.  During programmed atrial stimulation, there were no AH jumps and no echo beats, and no inducible SVT.  Next, additional rapid atrial pacing was carried out from the coronary sinus at a pacing cycle length of 290 milliseconds and stepwise decreased down to 180 milliseconds  resulting in the initiation of atrial flutter.  Mapping was then carried out.  Typical counterclockwise atrial flutter was demonstrated.  The 7-French quadripolar ablation catheter was then advanced by way of the right femoral vein into the right atrium and mapping of the atrial flutter isthmus was carried out.  This was of normal size and orientation.  A total of 6 RF energy applications were delivered to the atrial flutter isthmus.  During RF energy application, atrial flutter was terminated and pacing from the coronary sinus demonstrated bidirectional block in the atrial flutter isthmus.  The patient was observed for approximately 15 minutes.  During this time, rapid ventricular pacing was carried out from the right ventricle at a pacing cycle length of 600 milliseconds stepwise and this demonstrated VA dissociation.  Programmed ventricular stimulation was carried out from the right ventricle at 600 milliseconds also demonstrating VA dissociation.  Additional rapid atrial pacing from the coronary sinus demonstrated persistence of atrial flutter isthmus block.  The catheters were removed.  Hemostasis was assured and the patient was returned to his room in satisfactory condition.  COMPLICATIONS:  There were no immediate procedure complications.  RESULTS:  A.  Baseline ECG.  Baseline ECG demonstrates sinus rhythm, normal axis and intervals. B.  Baseline intervals.  Sinus node cycle length was 676 milliseconds. The QRS duration  was 96 milliseconds.  The HV interval was initially measured at 70 milliseconds, although repeat measurement demonstrated 43 milliseconds.  AH interval was 78 milliseconds. C.  Rapid ventricular pacing.  Rapid ventricular pacing was carried out from the right ventricle demonstrating VA dissociation at 600 milliseconds. D.  Programmed ventricular stimulation.  Programmed ventricular stimulation was carried out from the right ventricle demonstrating  VA dissociation at 600 milliseconds. E.  Rapid atrial pacing.  Rapid atrial pacing was carried out from the atrium at base drive cycle length of 161 milliseconds and stepwise decreased down to 280 milliseconds where AV Wenckebach was observed. During rapid atrial pacing, the PR interval was less than the RR interval and there was no inducible SVT.  Additional decrements of atrial pacing down to 180 milliseconds resulted in the initiation of atrial flutter. F.  Programmed atrial stimulation.  Programmed atrial stimulation was carried out from the atrium at a base drive cycle of 096 milliseconds. The S1 and S2 interval stepwise decreased down to 220 milliseconds where AV node ERP was observed.  During programmed atrial stimulation, there were no AH jumps, no echo beats, and no inducible SVT. G.  Arrhythmias observed: 1. Atrial flutter initiation was with rapid atrial pacing.  Duration     was sustained.  Cycle length was 200 millisecond and method of     termination was with catheter ablation.     a.     Mapping.  Mapping of atrial flutter isthmus demonstrated      usual size and orientation.     b.     RF energy application.  Total of 6 RF energy applications      were delivered to the atrial flutter isthmus resulting in      termination of atrial flutter, and creation of bidirectional block      and atrial flutter isthmus.  CONCLUSION:  Study demonstrates successful electrophysiologic study and RF catheter ablation of typical atrial flutter with a total of 6 RF energy applications delivered to the atrial flutter isthmus.  During RF energy application, atrial flutter was terminated and sinus rhythm was restored and atrial flutter block was demonstrated.     Doylene Canning. Ladona Ridgel, MD     GWT/MEDQ  D:  07/19/2012  T:  07/19/2012  Job:  [id number]

## 2012-07-19 NOTE — Op Note (Signed)
EPS/RFA of Atrial flutter without immediate complication. D# [id number].

## 2012-07-19 NOTE — Progress Notes (Signed)
Utilization Review Completed.   Herby Amick, RN, BSN Nurse Case Manager  [phone]  

## 2012-07-19 NOTE — Interval H&P Note (Signed)
History and Physical Interval Note:  07/19/2012 7:02 AM  [name]  has presented today for surgery, with the diagnosis of AFlutter  The various methods of treatment have been discussed with the patient and family. After consideration of risks, benefits and other options for treatment, the patient has consented to  Procedure(s) (LRB) with comments: ATRIAL FLUTTER ABLATION (N/A) as a surgical intervention .  The patient's history has been reviewed, patient examined, no change in status, stable for surgery.  I have reviewed the patient's chart and labs.  Questions were answered to the patient's satisfaction.     Leonia Reeves.D.

## 2012-07-19 NOTE — H&P (View-Only) (Signed)
HPI Mr. Lamons returns today for followup. He is a pleasant 66 yo man with a h/o atrial flutter and HTN. He was hospitalized with atrial flutter several weeks ago and underwent DCCV. He was placed on Eliquis. He has done well in the interim. He has not had any additional symptomatic atrial flutter. No palpitations or chest pain. Allergies  Allergen Reactions  . Sulfa Antibiotics Other (See Comments)    migraine     Current Outpatient Prescriptions  Medication Sig Dispense Refill  . ALPRAZolam (XANAX) 0.5 MG tablet Take 0.5 mg by mouth 3 (three) times daily.      Marland Kitchen apixaban (ELIQUIS) 5 MG TABS tablet Take 1 tablet (5 mg total) by mouth 2 (two) times daily.  60 tablet  11  . Azelaic Acid (FINACEA) 15 % cream Apply topically 2 (two) times daily. After skin is thoroughly washed and patted dry, gently but thoroughly massage a thin film of azelaic acid cream into the affected area twice daily, in the morning and evening.      . Budesonide (UCERIS) 9 MG TB24 Take 1 tablet by mouth daily.      . diphenoxylate-atropine (LOMOTIL) 2.5-0.025 MG per tablet Take 1 tablet by mouth 4 (four) times daily.      . fesoterodine (TOVIAZ) 4 MG TB24 Take 4 mg by mouth daily.      . hydroxychloroquine (PLAQUENIL) 200 MG tablet Take 200 mg by mouth 2 (two) times daily.      Marland Kitchen lisinopril-hydrochlorothiazide (PRINZIDE,ZESTORETIC) 10-12.5 MG per tablet Take 0.5 tablets by mouth daily.      . metoprolol tartrate (LOPRESSOR) 25 MG tablet Take 1 tablet (25 mg total) by mouth 2 (two) times daily.  60 tablet  11  . OVER THE COUNTER MEDICATION Take 1 packet by mouth daily. [employer] brand vitamin multi pack      . sildenafil (VIAGRA) 100 MG tablet Take 50 mg by mouth daily as needed. For ED      . tadalafil (CIALIS) 20 MG tablet Take 20 mg by mouth daily as needed. For ED      . temazepam (RESTORIL) 7.5 MG capsule Take 7.5-15 mg by mouth at bedtime.      Marland Kitchen testosterone (ANDROGEL) 50 MG/5GM GEL Place 5 g onto the skin daily. 4 pumps       . traMADol (ULTRAM) 50 MG tablet Take 100 mg by mouth every 8 (eight) hours as needed. For pain         Past Medical History  Diagnosis Date  . Hypertension   . Rheumatoid arthritis     Dr. Corliss Skains  . BPH (benign prostatic hyperplasia)   . HTN (hypertension)   . History of pneumonia   . IBS (irritable bowel syndrome)   . PUD (peptic ulcer disease)     Dr. Dulce Sellar    ROS:   All systems reviewed and negative except as noted in the HPI.   Past Surgical History  Procedure Date  . 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 Knee arthroscopy 2011    left     Family History  Problem Relation Age of Onset  . CAD Father     MI x 3 (1st 31s)  . Heart failure Father      History   Social History  . Marital Status: Married    Spouse Name: N/A    Number of Children: 1  . Years of Education: N/A   Occupational History  .  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 Smoking status: Former Smoker    Types: Cigarettes    Quit date: 06/07/1999  . Smokeless tobacco: Not on file  . Alcohol Use: Yes     Comment: occasionally  . Drug Use: No  . Sexually Active: Not on file   Other Topics Concern  . Not on file   Social History Narrative  . No narrative on file     BP 124/74  Pulse 93  Ht 5\' 10"  (1.778 m)  Wt 193 lb (87.544 kg)  BMI 27.69 kg/m2  SpO2 97%  Physical Exam:  Well appearing 66 yo man, NAD HEENT: Unremarkable Neck:  7 cm JVD, no thyromegally Lungs:  Clear with no wheezes, rales, or rhonchi HEART:  Regular rate rhythm, no murmurs, no rubs, no clicks Abd:  soft, positive bowel sounds, no organomegally, no rebound, no guarding Ext:  2 plus pulses, no edema, no cyanosis, no clubbing Skin:  No rashes no nodules Neuro:  CN II through XII intact, motor grossly intact  EKG NSR  Assess/Plan:

## 2012-08-24 ENCOUNTER — Ambulatory Visit (INDEPENDENT_AMBULATORY_CARE_PROVIDER_SITE_OTHER): Payer: Commercial Managed Care - PPO | Admitting: Internal Medicine

## 2012-08-24 ENCOUNTER — Encounter: Payer: Self-pay | Admitting: Internal Medicine

## 2012-08-24 VITALS — BP 128/84 | HR 82 | Ht 70.0 in | Wt 187.0 lb

## 2012-08-24 DIAGNOSIS — I10 Essential (primary) hypertension: Secondary | ICD-10-CM

## 2012-08-24 DIAGNOSIS — I4891 Unspecified atrial fibrillation: Secondary | ICD-10-CM

## 2012-08-24 DIAGNOSIS — I4892 Unspecified atrial flutter: Secondary | ICD-10-CM

## 2012-08-24 MED ORDER — ASPIRIN EC 81 MG PO TBEC
81.0000 mg | DELAYED_RELEASE_TABLET | Freq: Every day | ORAL | Status: AC
Start: 2012-08-24 — End: ?

## 2012-08-24 NOTE — Progress Notes (Signed)
HPI Mr. Pileggi returns today for followup. He is a very pleasant 67 year old man with a history of palpitations, atrial flutter, hypertension, who underwent catheter ablation several weeks ago. In the interim, he has done well. He denies chest pain, shortness of breath, or syncope. No palpitations. He has reduced his alcohol consumption. He stopped taking his anticoagulation. Allergies  Allergen Reactions  . Sulfa Antibiotics Other (See Comments)    migraine     Current Outpatient Prescriptions  Medication Sig Dispense Refill  . ALPRAZolam (XANAX) 0.5 MG tablet Take 0.5 mg by mouth 3 (three) times daily as needed.       Marland Kitchen apixaban (ELIQUIS) 5 MG TABS tablet Take 1 tablet (5 mg total) by mouth 2 (two) times daily.  60 tablet  11  . Azelaic Acid (FINACEA) 15 % cream Apply topically 2 (two) times daily. After skin is thoroughly washed and patted dry, gently but thoroughly massage a thin film of azelaic acid cream into the affected area twice daily, in the morning and evening.      . Budesonide (UCERIS) 9 MG TB24 Take 1 tablet by mouth daily.      . diphenoxylate-atropine (LOMOTIL) 2.5-0.025 MG per tablet Take 1 tablet by mouth 4 (four) times daily.      Marland Kitchen doxycycline (VIBRAMYCIN) 100 MG capsule       . fesoterodine (TOVIAZ) 4 MG TB24 Take 4 mg by mouth daily.      . hydroxychloroquine (PLAQUENIL) 200 MG tablet Take 200 mg by mouth 2 (two) times daily.      Marland Kitchen lisinopril-hydrochlorothiazide (PRINZIDE,ZESTORETIC) 10-12.5 MG per tablet Take 0.5 tablets by mouth daily.      . metoprolol tartrate (LOPRESSOR) 25 MG tablet Take 1 tablet (25 mg total) by mouth 2 (two) times daily.  60 tablet  11  . metroNIDAZOLE (METROGEL) 1 % gel       . OVER THE COUNTER MEDICATION Take 1 packet by mouth daily. [employer] brand vitamin multi pack      . sildenafil (VIAGRA) 100 MG tablet Take 50 mg by mouth daily as needed. For ED      . tadalafil (CIALIS) 20 MG tablet Take 20 mg by mouth daily as needed. For ED      .  temazepam (RESTORIL) 7.5 MG capsule Take 7.5-15 mg by mouth at bedtime.      Marland Kitchen testosterone (ANDROGEL) 50 MG/5GM GEL Place 5 g onto the skin daily. 4 pumps      . traMADol (ULTRAM) 50 MG tablet Take 100 mg by mouth every 8 (eight) hours as needed. For pain      . aspirin EC 81 MG tablet Take 1 tablet (81 mg total) by mouth daily.  90 tablet  3     Past Medical History  Diagnosis Date  . Hypertension   . Rheumatoid arthritis     Dr. Corliss Skains  . BPH (benign prostatic hyperplasia)   . HTN (hypertension)   . History of pneumonia   . IBS (irritable bowel syndrome)   . PUD (peptic ulcer disease)     Dr. Dulce Sellar  . Dysrhythmia     atrial flutter    ROS:   All systems reviewed and negative except as noted in the HPI.   Past Surgical History  Procedure Date  . 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 Knee arthroscopy 2011    left  . Ablation of dysrhythmic focus 2013  . Appendectomy  Family History  Problem Relation Age of Onset  . CAD Father     MI x 3 (1st 4s)  . Heart failure Father      History   Social History  . Marital Status: Married    Spouse Name: N/A    Number of Children: 1  . Years of Education: N/A   Occupational History  . 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 Smoking status: Former Smoker    Types: Cigarettes    Quit date: 06/07/1999  . Smokeless tobacco: Never Used  . Alcohol Use: Yes     Comment: occasionally  . Drug Use: No  . Sexually Active: Not on file   Other Topics Concern  . Not on file   Social History Narrative  . No narrative on file     BP 128/84  Pulse 82  Ht 5\' 10"  (1.778 m)  Wt 187 lb (84.823 kg)  BMI 26.83 kg/m2  Physical Exam:  Well appearing 67 year old man, NAD HEENT: Unremarkable Neck:  No JVD, no thyromegally Lungs:  Clear with no wheezes, rales, or rhonchi. HEART:  Regular rate rhythm, no murmurs, no rubs, no clicks Abd:  soft, positive bowel  sounds, no organomegally, no rebound, no guarding Ext:  2 plus pulses, no edema, no cyanosis, no clubbing Skin:  No rashes no nodules Neuro:  CN II through XII intact, motor grossly intact  EKG Normal sinus rhythm with rightward axis and incomplete right bundle branch block  Assess/Plan:

## 2012-08-24 NOTE — Patient Instructions (Signed)
Your physician recommends that you schedule a follow-up appointment as needed  Your physician has recommended you make the following change in your medication:  1) Start 81mg  Aspirin daily

## 2012-08-24 NOTE — Assessment & Plan Note (Signed)
His blood pressure today is well controlled. He will continue his current medical therapy, and maintain a low-sodium diet.

## 2012-08-24 NOTE — Assessment & Plan Note (Signed)
The patient is maintaining sinus rhythm very nicely after catheter ablation of his atrial flutter. We'll stop his anticoagulation and his beta blocker. I've asked the patient to take low-dose aspirin.

## 2013-06-14 ENCOUNTER — Ambulatory Visit (INDEPENDENT_AMBULATORY_CARE_PROVIDER_SITE_OTHER): Payer: Commercial Managed Care - PPO

## 2013-06-14 VITALS — BP 108/66 | HR 86 | Resp 16 | Ht 70.0 in | Wt 176.0 lb

## 2013-06-14 DIAGNOSIS — M069 Rheumatoid arthritis, unspecified: Secondary | ICD-10-CM

## 2013-06-14 DIAGNOSIS — M7752 Other enthesopathy of left foot: Secondary | ICD-10-CM

## 2013-06-14 DIAGNOSIS — D219 Benign neoplasm of connective and other soft tissue, unspecified: Secondary | ICD-10-CM

## 2013-06-14 DIAGNOSIS — M7750 Other enthesopathy of unspecified foot: Secondary | ICD-10-CM

## 2013-06-14 DIAGNOSIS — D3610 Benign neoplasm of peripheral nerves and autonomic nervous system, unspecified: Secondary | ICD-10-CM

## 2013-06-14 DIAGNOSIS — B070 Plantar wart: Secondary | ICD-10-CM

## 2013-06-14 MED ORDER — TRIAMCINOLONE ACETONIDE 10 MG/ML IJ SUSP: 10.0000 mg | Freq: Once | INTRAMUSCULAR | Status: AC

## 2013-06-14 NOTE — Progress Notes (Signed)
  Subjective:    Patient ID: [name], male    DOB: 1946-05-25, 67 y.o.   MRN: [id number] "Same problem I was here for the last 3-4 times, the Plantar Warts.  They just won't heal."  HPI    Review of Systems  Constitutional: Negative.   HENT: Negative.   Respiratory: Negative.   Endocrine: Negative.   Musculoskeletal: Negative.   Skin: Negative.   Hematological: Negative.   Psychiatric/Behavioral: Negative.   All other systems reviewed and are negative.       Objective:   Physical Exam Vascular status is intact with pedal pulses palpable DP postal for PT plus one over 4 bilateral. Capillary fill time 3-4 seconds bilateral skin temperature warm turgor normal. There is decreased fat pad plantar aspect of left foot. There is keratoses subsecond third metatarsal area consistent with a verrucoid lesion patient been applying salicylic acid over the last several months her continues to have pain plantarly. Her the pain is not in the area the lesion is more medial subsecond MTP area itself there is no sign of infection no discharge or drainage plantarly. There is swelling pain tenderness around second MTP joint both a dorsal flexion plantar flexion direct lateral compression. Patient has a history of arthropathy or rheumatoid arthritis taking a long list of medications recently started on and brittle also taking tramadol. Pre-foot previously was on a other arthritis medications.       Assessment & Plan:  Assessment this time is capsulitis second MTP area versus early neuroma symptomology. Although there is a keratotic lesion or mild verrucoid lesion patient been managing this successfully with salicylic acid to at this time I do feel patient does have keratosis he is self managing over the capsulitis of the second MTP joint secondary to  arthropathy is significant  secondary neuroma symptomology. At this time a recommendation discussed an injection of 10 mg Kenalog in 20 mg Xylocaine  plain infiltrated to the second MTP joint capsule and second interspace left foot. Patient tolerated the injection well provided almost immediate relief. Suggest a 1-2 months followup if needed a stent progress. Maintain a cushioned thick soled shoe at all times. Patient is to followup with his rheumatologist in February of this coming year. Again only recently changed and started on his new Embrel medication. Followup with in one to 2 months as indicated  Alvan Dame DPM

## 2013-06-14 NOTE — Patient Instructions (Signed)

## 2014-06-29 ENCOUNTER — Encounter (HOSPITAL_COMMUNITY): Payer: Self-pay | Admitting: Cardiology

## 2015-10-08 ENCOUNTER — Other Ambulatory Visit: Payer: Self-pay | Admitting: Orthopaedic Surgery

## 2015-10-08 DIAGNOSIS — M542 Cervicalgia: Secondary | ICD-10-CM

## 2015-10-16 ENCOUNTER — Ambulatory Visit
Admission: RE | Admit: 2015-10-16 | Discharge: 2015-10-16 | Disposition: A | Discharge status: Home / Self Care | Payer: Commercial Managed Care - PPO | Source: Ambulatory Visit | Attending: Orthopaedic Surgery | Admitting: Orthopaedic Surgery

## 2015-10-16 DIAGNOSIS — M542 Cervicalgia: Secondary | ICD-10-CM

## 2016-05-20 ENCOUNTER — Other Ambulatory Visit: Payer: Self-pay | Admitting: Rheumatology

## 2016-05-20 DIAGNOSIS — Z79899 Other long term (current) drug therapy: Secondary | ICD-10-CM

## 2016-05-20 DIAGNOSIS — M0609 Rheumatoid arthritis without rheumatoid factor, multiple sites: Secondary | ICD-10-CM

## 2016-05-20 LAB — CBC WITH DIFFERENTIAL/PLATELET
BASOS ABS: 42 {cells}/uL (ref 0–200)
BASOS PCT: 1 %
EOS ABS: 378 {cells}/uL (ref 15–500)
Eosinophils Relative: 9 %
HEMATOCRIT: 44.3 % (ref 38.5–50.0)
Hemoglobin: 14.6 g/dL (ref 13.2–17.1)
LYMPHS PCT: 34 %
Lymphs Abs: 1428 cells/uL (ref 850–3900)
MCH: 32.3 pg (ref 27.0–33.0)
MCHC: 33 g/dL (ref 32.0–36.0)
MCV: 98 fL (ref 80.0–100.0)
MONO ABS: 756 {cells}/uL (ref 200–950)
MPV: 9.9 fL (ref 7.5–12.5)
Monocytes Relative: 18 %
Neutro Abs: 1596 cells/uL (ref 1500–7800)
Neutrophils Relative %: 38 %
Platelets: 242 10*3/uL (ref 140–400)
RBC: 4.52 MIL/uL (ref 4.20–5.80)
RDW: 13.8 % (ref 11.0–15.0)
WBC: 4.2 10*3/uL (ref 3.8–10.8)

## 2016-05-21 ENCOUNTER — Telehealth: Payer: Self-pay | Admitting: Radiology

## 2016-05-21 LAB — COMPLETE METABOLIC PANEL WITH GFR
ALT: 23 U/L (ref 9–46)
AST: 28 U/L (ref 10–35)
Albumin: 4.2 g/dL (ref 3.6–5.1)
Alkaline Phosphatase: 51 U/L (ref 40–115)
BILIRUBIN TOTAL: 0.2 mg/dL (ref 0.2–1.2)
BUN: 19 mg/dL (ref 7–25)
CALCIUM: 9.5 mg/dL (ref 8.6–10.3)
CHLORIDE: 98 mmol/L (ref 98–110)
CO2: 29 mmol/L (ref 20–31)
CREATININE: 1.23 mg/dL (ref 0.70–1.25)
GFR, EST AFRICAN AMERICAN: 69 mL/min (ref >=60)
GFR, Est Non African American: 60 mL/min (ref >=60)
Glucose, Bld: 97 mg/dL (ref 65–99)
Potassium: 5.1 mmol/L (ref 3.5–5.3)
Sodium: 135 mmol/L (ref 135–146)
TOTAL PROTEIN: 6.7 g/dL (ref 6.1–8.1)

## 2016-05-21 NOTE — Progress Notes (Signed)
CBC, CMP normal

## 2016-05-21 NOTE — Telephone Encounter (Signed)
I have called patient to advise labs are normal  

## 2016-06-10 ENCOUNTER — Other Ambulatory Visit: Payer: Self-pay | Admitting: Rheumatology

## 2016-06-10 NOTE — Telephone Encounter (Signed)
Labs WNL 05/20/16 Next visit 09/15/16 TB neg 03/27/16 Last visit 04/16/16 Ok to refill per Dr Estanislado Pandy

## 2016-07-07 ENCOUNTER — Other Ambulatory Visit: Payer: Self-pay | Admitting: Rheumatology

## 2016-07-07 NOTE — Telephone Encounter (Signed)
Last Visit: 04/16/16 Next Visit: 09/15/16  Okay to refill Xanax?

## 2016-07-07 NOTE — Telephone Encounter (Signed)
ok 

## 2016-07-08 ENCOUNTER — Other Ambulatory Visit: Payer: Self-pay | Admitting: *Deleted

## 2016-08-19 ENCOUNTER — Other Ambulatory Visit: Payer: Self-pay | Admitting: Radiology

## 2016-08-19 DIAGNOSIS — Z79899 Other long term (current) drug therapy: Secondary | ICD-10-CM

## 2016-08-19 LAB — CBC WITH DIFFERENTIAL/PLATELET
BASOS PCT: 1 %
Basophils Absolute: 47 cells/uL (ref 0–200)
EOS PCT: 9 %
Eosinophils Absolute: 423 cells/uL (ref 15–500)
HEMATOCRIT: 43.5 % (ref 38.5–50.0)
HEMOGLOBIN: 14.5 g/dL (ref 13.2–17.1)
Lymphocytes Relative: 38 %
Lymphs Abs: 1786 cells/uL (ref 850–3900)
MCH: 32.7 pg (ref 27.0–33.0)
MCHC: 33.3 g/dL (ref 32.0–36.0)
MCV: 98 fL (ref 80.0–100.0)
MONO ABS: 611 {cells}/uL (ref 200–950)
MPV: 9.7 fL (ref 7.5–12.5)
Monocytes Relative: 13 %
NEUTROS ABS: 1833 {cells}/uL (ref 1500–7800)
NEUTROS PCT: 39 %
Platelets: 224 10*3/uL (ref 140–400)
RBC: 4.44 MIL/uL (ref 4.20–5.80)
RDW: 14.1 % (ref 11.0–15.0)
WBC: 4.7 10*3/uL (ref 3.8–10.8)

## 2016-08-20 LAB — COMPLETE METABOLIC PANEL WITH GFR
ALBUMIN: 4.1 g/dL (ref 3.6–5.1)
ALK PHOS: 57 U/L (ref 40–115)
ALT: 15 U/L (ref 9–46)
AST: 21 U/L (ref 10–35)
BUN: 19 mg/dL (ref 7–25)
CO2: 28 mmol/L (ref 20–31)
CREATININE: 1.29 mg/dL — AB (ref 0.70–1.25)
Calcium: 9.2 mg/dL (ref 8.6–10.3)
Chloride: 101 mmol/L (ref 98–110)
GFR, Est African American: 65 mL/min (ref >=60)
GFR, Est Non African American: 56 mL/min — ABNORMAL LOW (ref >=60)
GLUCOSE: 77 mg/dL (ref 65–99)
Potassium: 4.6 mmol/L (ref 3.5–5.3)
SODIUM: 137 mmol/L (ref 135–146)
Total Bilirubin: 0.5 mg/dL (ref 0.2–1.2)
Total Protein: 6.8 g/dL (ref 6.1–8.1)

## 2016-08-20 NOTE — Progress Notes (Signed)
Mild decrease in GFR

## 2016-08-21 ENCOUNTER — Other Ambulatory Visit: Payer: Self-pay | Admitting: Rheumatology

## 2016-08-21 NOTE — Telephone Encounter (Signed)
Last Visit: 04/16/16 Next Visit: 09/15/16 UDS: 06/26/15 Narc Agreement: 06/28/15  Patient advise we need to update UDS and narcotic agreement. Will do that at his appointment on 09/15/16.  Okay to refill Tramadol?

## 2016-08-26 ENCOUNTER — Other Ambulatory Visit: Payer: Self-pay | Admitting: Rheumatology

## 2016-08-26 NOTE — Telephone Encounter (Signed)
Last Visit: 04/16/16 Next Visit: 09/15/16 Labs: 08/19/16 Creat 1.29 GFR 56 TB Gold: 03/2016 Neg  Okay to refill Enbrel?

## 2016-09-08 DIAGNOSIS — M0609 Rheumatoid arthritis without rheumatoid factor, multiple sites: Secondary | ICD-10-CM | POA: Insufficient documentation

## 2016-09-08 DIAGNOSIS — Z96652 Presence of left artificial knee joint: Secondary | ICD-10-CM | POA: Insufficient documentation

## 2016-09-08 DIAGNOSIS — M47816 Spondylosis without myelopathy or radiculopathy, lumbar region: Secondary | ICD-10-CM | POA: Insufficient documentation

## 2016-09-08 DIAGNOSIS — M47812 Spondylosis without myelopathy or radiculopathy, cervical region: Secondary | ICD-10-CM | POA: Insufficient documentation

## 2016-09-08 DIAGNOSIS — M19041 Primary osteoarthritis, right hand: Secondary | ICD-10-CM | POA: Insufficient documentation

## 2016-09-08 DIAGNOSIS — M170 Bilateral primary osteoarthritis of knee: Secondary | ICD-10-CM | POA: Insufficient documentation

## 2016-09-08 DIAGNOSIS — M19042 Primary osteoarthritis, left hand: Secondary | ICD-10-CM

## 2016-09-08 DIAGNOSIS — Z8659 Personal history of other mental and behavioral disorders: Secondary | ICD-10-CM | POA: Insufficient documentation

## 2016-09-08 DIAGNOSIS — F5101 Primary insomnia: Secondary | ICD-10-CM | POA: Insufficient documentation

## 2016-09-08 NOTE — Progress Notes (Signed)
Office Visit Note  Patient: [name]             Date of Birth: 03/30/1946           MRN: [id number]             PCP: Lynne Logan, MD Referring: Donald Prose, MD Visit Date: 09/15/2016 Occupation: @GUAROCC @    Subjective:  Follow-up on rheumatoid arthritis   History of Present Illness: [name] is a 71 y.o. male with history of seronegative rheumatoid arthritis. He states he had right [facility] surgery on 06/27/2016 by Dr. Amedeo Plenty and he is gradually recovering from that. He is still concerned about has some right fifth PIP joint which has decreased flexibility. He has not had any flare of his rheumatoid arthritis. His knee joints are doing fairly well. C-spine and lower back pain is tolerable.   Activities of Daily Living:  Patient reports morning stiffness for 5 minutes.   Patient Denies nocturnal pain.  Difficulty dressing/grooming: Denies Difficulty climbing stairs: Denies Difficulty getting out of chair: Denies Difficulty using hands for taps, buttons, cutlery, and/or writing: Reports   Review of Systems  Constitutional: Negative for fatigue, night sweats and weakness ( ).  HENT: Negative for mouth sores, mouth dryness and nose dryness.   Eyes: Positive for dryness. Negative for redness.  Respiratory: Negative for shortness of breath and difficulty breathing.   Cardiovascular: Negative for chest pain, palpitations, hypertension, irregular heartbeat and swelling in legs/feet.  Gastrointestinal: Negative for constipation and diarrhea.  Endocrine: Negative for increased urination.  Musculoskeletal: Positive for arthralgias, joint pain and morning stiffness. Negative for joint swelling, myalgias, muscle weakness, muscle tenderness and myalgias.  Skin: Negative for color change, rash, hair loss, nodules/bumps, skin tightness, ulcers and sensitivity to sunlight.  Allergic/Immunologic: Negative for susceptible to infections.  Neurological: Negative for dizziness,  fainting, memory loss and night sweats.  Hematological: Negative for swollen glands.  Psychiatric/Behavioral: Negative for depressed mood and sleep disturbance. The patient is not nervous/anxious.     PMFS History:  Patient Active Problem List   Diagnosis Date Noted  . Rheumatoid arthritis of multiple sites with negative rheumatoid factor ([place]) 09/08/2016  . Primary osteoarthritis of both knees 09/08/2016  . History of left knee replacement 09/08/2016  . Primary osteoarthritis of both hands 09/08/2016  . DJD (degenerative joint disease), cervical 09/08/2016  . Spondylosis of lumbar region without myelopathy or radiculopathy 09/08/2016  . Primary insomnia 09/08/2016  . History of anxiety 09/08/2016  . High risk medication use 08/19/2016  . Atrial flutter with rapid ventricular response ([place]) 06/06/2012  . Substernal chest pain 06/06/2012  . Hypotension 06/06/2012  . Hypertension 06/06/2012    Past Medical History:  Diagnosis Date  . BPH (benign prostatic hyperplasia)   . Dysrhythmia    atrial flutter  . History of pneumonia   . HTN (hypertension)   . Hypertension   . IBS (irritable bowel syndrome)   . PUD (peptic ulcer disease)    Dr. Paulita Fujita  . Rheumatoid arthritis(714.0)    Dr. Estanislado Pandy    Family History  Problem Relation Age of Onset  . CAD Father     MI x 3 (1st 63s)  . Heart failure Father    Past Surgical History:  Procedure Laterality Date  . ABLATION OF DYSRHYTHMIC FOCUS  2013  . APPENDECTOMY    . ATRIAL FLUTTER ABLATION N/A 07/19/2012   Procedure: ATRIAL FLUTTER ABLATION;  Surgeon: Evans Lance, MD;  Location: [facility] CATH LAB;  Service: Cardiovascular;  Laterality: N/A;  . CARDIOVERSION N/A 06/07/2012   Procedure: CARDIOVERSION;  Surgeon: Evans Lance, MD;  Location: [facility] CATH LAB;  Service: Cardiovascular;  Laterality: N/A;  . KNEE ARTHROSCOPY  2011   left  . LEFT HEART CATHETERIZATION WITH CORONARY ANGIOGRAM N/A 06/07/2012   Procedure: LEFT HEART  CATHETERIZATION WITH CORONARY ANGIOGRAM;  Surgeon: Peter M Martinique, MD;  Location: [facility] CATH LAB;  Service: Cardiovascular;  Laterality: N/A;  . TOTAL KNEE ARTHROPLASTY  2012   left total knee   Social History   Social History Narrative  . No narrative on file     Objective: Vital Signs: BP 118/80   Pulse 91   Resp 13   Ht 5' 10"  (1.778 m)   Wt 186 lb (84.4 kg)   BMI 26.69 kg/m    Physical Exam  Constitutional: He is oriented to person, place, and time. He appears well-developed and well-nourished.  HENT:  Head: Normocephalic and atraumatic.  Eyes: Conjunctivae and EOM are normal. Pupils are equal, round, and reactive to light.  Neck: Normal range of motion. Neck supple.  Cardiovascular: Normal rate, regular rhythm and normal heart sounds.   Pulmonary/Chest: Effort normal and breath sounds normal.  Abdominal: Soft. Bowel sounds are normal.  Neurological: He is alert and oriented to person, place, and time.  Skin: Skin is warm and dry. Capillary refill takes less than 2 seconds.  Psychiatric: He has a normal mood and affect. His behavior is normal.  Nursing note and vitals reviewed.    Musculoskeletal Exam: C-spine some limitation with range of motion and thoracic lumbar spine good range of motion shoulder joints good range of motion all the joints are good range of motion his postsurgical changes over right CMC joint. His thickening of PIP/DIP joints with no synovitis. He has very limited range of motion of his right fifth PIP of his hand. Hip joints are good range of motion. Knee replacement is doing well. MTPs and PIPs had no synovitis in his feet.  CDAI Exam: CDAI Homunculus Exam:   Tenderness:  Right hand: 5th PIP  Joint Counts:  CDAI Tender Joint count: 1 CDAI Swollen Joint count: 0  Global Assessments:  Patient Global Assessment: 1 Provider Global Assessment: 1  CDAI Calculated Score: 3    Investigation: Findings:  02/09/2016 negative TB gold  06/2015 UDS  and narcotic agreement 01/20/2015 .  He did get x-ray of bilateral hands today, 2 views, for comparison, and we compared to his x-rays of 2013.  The only progression is the arthritis progression in his left CMC joint.  Otherwise, the rest of the joints are stable.  He had all PIP/DIP narrowing, especially right 5th PIP severe narrowing and right 2nd MCP narrowing, left 1st CMC narrowing.  2009 hepatitis panel negative  Orders Only on 08/19/2016  Component Date Value Ref Range Status  . WBC 08/19/2016 4.7  3.8 - 10.8 K/uL Final  . RBC 08/19/2016 4.44  4.20 - 5.80 MIL/uL Final  . Hemoglobin 08/19/2016 14.5  13.2 - 17.1 g/dL Final  . HCT 08/19/2016 43.5  38.5 - 50.0 % Final  . MCV 08/19/2016 98.0  80.0 - 100.0 fL Final  . MCH 08/19/2016 32.7  27.0 - 33.0 pg Final  . MCHC 08/19/2016 33.3  32.0 - 36.0 g/dL Final  . RDW 08/19/2016 14.1  11.0 - 15.0 % Final  . Platelets 08/19/2016 224  140 - 400 K/uL Final  . MPV 08/19/2016 9.7  7.5 - 12.5 fL  Final  . Neutro Abs 08/19/2016 1833  1,500 - 7,800 cells/uL Final  . Lymphs Abs 08/19/2016 1786  850 - 3,900 cells/uL Final  . Monocytes Absolute 08/19/2016 611  200 - 950 cells/uL Final  . Eosinophils Absolute 08/19/2016 423  15 - 500 cells/uL Final  . Basophils Absolute 08/19/2016 47  0 - 200 cells/uL Final  . Neutrophils Relative % 08/19/2016 39  % Final  . Lymphocytes Relative 08/19/2016 38  % Final  . Monocytes Relative 08/19/2016 13  % Final  . Eosinophils Relative 08/19/2016 9  % Final  . Basophils Relative 08/19/2016 1  % Final  . Smear Review 08/19/2016 Criteria for review not met   Final  . Sodium 08/19/2016 137  135 - 146 mmol/L Final  . Potassium 08/19/2016 4.6  3.5 - 5.3 mmol/L Final  . Chloride 08/19/2016 101  98 - 110 mmol/L Final  . CO2 08/19/2016 28  20 - 31 mmol/L Final  . Glucose, Bld 08/19/2016 77  65 - 99 mg/dL Final  . BUN 08/19/2016 19  7 - 25 mg/dL Final  . Creat 08/19/2016 1.29* 0.70 - 1.25 mg/dL Final   Comment:   For patients  > or = 70 years of age: The upper reference limit for Creatinine is approximately 13% higher for people identified as African-American.     . Total Bilirubin 08/19/2016 0.5  0.2 - 1.2 mg/dL Final  . Alkaline Phosphatase 08/19/2016 57  40 - 115 U/L Final  . AST 08/19/2016 21  10 - 35 U/L Final  . ALT 08/19/2016 15  9 - 46 U/L Final  . Total Protein 08/19/2016 6.8  6.1 - 8.1 g/dL Final  . Albumin 08/19/2016 4.1  3.6 - 5.1 g/dL Final  . Calcium 08/19/2016 9.2  8.6 - 10.3 mg/dL Final  . GFR, Est African American 08/19/2016 65  >=60 mL/min Final  . GFR, Est Non African American 08/19/2016 56* >=60 mL/min Final      Imaging: No results found.  Speciality Comments: No specialty comments available.    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  High risk medication use - Enbrel /PLQ TB gold negative July 2017, - Plan: CBC with Differential/Platelet, COMPLETE METABOLIC PANEL WITH GFR, Quantiferon tb gold assay (blood)  Primary osteoarthritis of both knees: He is doing fairly well  History of left knee replacement  Primary osteoarthritis of both hands: Status post left [facility] surgery in December 2017. He is recovering gradually from that.  DJD (degenerative joint disease), cervical: Minimal stiffness  Spondylosis of lumbar region without myelopathy or radiculopathy: Doing fairly well  Other medical problems are listed as follows:  Primary insomnia  History of anxiety  History of atrial flutter  Encounter for therapeutic drug monitoring, due to chronic pain he does take tramadol we will renew his narcotic agreement today and we'll obtain urine drug screen. - Plan: Pain Mgmt, Profile 5 w/Conf, U    Orders: Orders Placed This Encounter  Procedures  . CBC with Differential/Platelet  . COMPLETE  METABOLIC PANEL WITH GFR  . Quantiferon tb gold assay (blood)  . Pain Mgmt, Profile 5 w/Conf, U   No orders of the defined types were placed in this encounter.   Face-to-face time spent with patient was 30 minutes. 50% of time was spent in counseling and coordination of  care.  Follow-Up Instructions: Return in about 5 months (around 02/12/2017) for Rheumatoid arthritis.   Bo Merino, MD  Note - This record has been created using Editor, commissioning.  Chart creation errors have been sought, but may not always  have been located. Such creation errors do not reflect on  the standard of medical care.

## 2016-09-15 ENCOUNTER — Ambulatory Visit (INDEPENDENT_AMBULATORY_CARE_PROVIDER_SITE_OTHER): Payer: Commercial Managed Care - PPO | Admitting: Rheumatology

## 2016-09-15 ENCOUNTER — Encounter: Payer: Self-pay | Admitting: Rheumatology

## 2016-09-15 VITALS — BP 118/80 | HR 91 | Resp 13 | Ht 70.0 in | Wt 186.0 lb

## 2016-09-15 DIAGNOSIS — M170 Bilateral primary osteoarthritis of knee: Secondary | ICD-10-CM

## 2016-09-15 DIAGNOSIS — M19042 Primary osteoarthritis, left hand: Secondary | ICD-10-CM

## 2016-09-15 DIAGNOSIS — F5101 Primary insomnia: Secondary | ICD-10-CM

## 2016-09-15 DIAGNOSIS — Z79899 Other long term (current) drug therapy: Secondary | ICD-10-CM

## 2016-09-15 DIAGNOSIS — M47816 Spondylosis without myelopathy or radiculopathy, lumbar region: Secondary | ICD-10-CM | POA: Diagnosis not present

## 2016-09-15 DIAGNOSIS — M19041 Primary osteoarthritis, right hand: Secondary | ICD-10-CM | POA: Diagnosis not present

## 2016-09-15 DIAGNOSIS — M5030 Other cervical disc degeneration, unspecified cervical region: Secondary | ICD-10-CM | POA: Diagnosis not present

## 2016-09-15 DIAGNOSIS — Z96652 Presence of left artificial knee joint: Secondary | ICD-10-CM

## 2016-09-15 DIAGNOSIS — M0609 Rheumatoid arthritis without rheumatoid factor, multiple sites: Secondary | ICD-10-CM

## 2016-09-15 DIAGNOSIS — M47812 Spondylosis without myelopathy or radiculopathy, cervical region: Secondary | ICD-10-CM

## 2016-09-15 DIAGNOSIS — Z5181 Encounter for therapeutic drug level monitoring: Secondary | ICD-10-CM

## 2016-09-15 DIAGNOSIS — Z8659 Personal history of other mental and behavioral disorders: Secondary | ICD-10-CM

## 2016-09-15 DIAGNOSIS — Z8679 Personal history of other diseases of the circulatory system: Secondary | ICD-10-CM

## 2016-09-15 NOTE — Progress Notes (Signed)
Rheumatology Medication Review by a Pharmacist Does the patient feel that his/her medications are working for him/her?  Yes Has the patient been experiencing any side effects to the medications prescribed?  No Does the patient have any problems obtaining medications?  No - patient gets his Enbrel through [facility]   Issues to address at subsequent visits: None   Pharmacist comments:  [name] is a 71 yo M who presents for follow up of his rheumatoid arthritis.  He is currently taking Enbrel 50 mg weekly and hydroxychloroquine 200 mg BID.  His most recent standing labs were on 08/19/16.  CBC was normal.  CMP was normal except for Cr 1.29, GFR 56.  He will be due for standing labs again in April 2018.  Patient's TB Gold was negative on 02/07/16.  He will be due for TB Gold again in July 2018.   I do not see a hydroxychloroquine eye exam within the past year.  Patient reports he had his hydroxychloroquine eye exam last summer and it was normal.  I called Dr. Ammie Ferrier office and requested to have those results sent to [place].  Patient denies any questions or concerns regarding his medications at this time.    Elisabeth Most, Pharm.D., BCPS, CPP Clinical Pharmacist Pager: [phone] Phone: [phone] 09/15/2016 9:17 AM

## 2016-09-15 NOTE — Patient Instructions (Signed)
Standing Labs We placed an order today for your standing lab work.    Please come back and get your standing labs in April 2018 and every 3 months.  You will be due for your TB Gold in July 2018.    We have open lab Monday through Friday from 8:30-11:30 AM and 1:30-4 PM at the office of Dr. Tresa Moore, PA.   The office is located at [address], [place], [place], [place] [address] No appointment is necessary.   Labs are drawn by [facility].  You may receive a bill from [facility] for your lab work.

## 2016-09-18 LAB — PAIN MGMT, PROFILE 5 W/CONF, U
AMINOCLONAZEPAM: NEGATIVE ng/mL (ref <25)
AMPHETAMINES: NEGATIVE ng/mL (ref <500)
Alphahydroxyalprazolam: 60 ng/mL — ABNORMAL HIGH (ref <25)
Alphahydroxymidazolam: NEGATIVE ng/mL (ref <50)
Alphahydroxytriazolam: NEGATIVE ng/mL (ref <50)
BENZODIAZEPINES: POSITIVE ng/mL — AB (ref <100)
Barbiturates: NEGATIVE ng/mL (ref <300)
CREATININE: 45.9 mg/dL (ref >=20.0)
Cocaine Metabolite: NEGATIVE ng/mL (ref <150)
Hydroxyethylflurazepam: NEGATIVE ng/mL (ref <50)
Lorazepam: NEGATIVE ng/mL (ref <50)
Marijuana Metabolite: NEGATIVE ng/mL (ref <20)
Methadone Metabolite: NEGATIVE ng/mL (ref <100)
Nordiazepam: NEGATIVE ng/mL (ref <50)
OXYCODONE: NEGATIVE ng/mL (ref <100)
Opiates: NEGATIVE ng/mL (ref <100)
Oxazepam: NEGATIVE ng/mL (ref <50)
Oxidant: NEGATIVE ug/mL (ref <200)
PH: 7.44 (ref 4.5–9.0)
TEMAZEPAM: NEGATIVE ng/mL (ref <50)

## 2016-09-18 NOTE — Progress Notes (Signed)
Consistent with treatment

## 2016-10-10 ENCOUNTER — Other Ambulatory Visit: Payer: Self-pay | Admitting: Rheumatology

## 2016-10-10 NOTE — Telephone Encounter (Signed)
ok 

## 2016-10-10 NOTE — Telephone Encounter (Signed)
Last Visit: 09/15/16 Next Visit: 01/29/17   Okay to refill Xanax?

## 2016-10-18 ENCOUNTER — Other Ambulatory Visit: Payer: Self-pay | Admitting: Rheumatology

## 2016-10-21 NOTE — Telephone Encounter (Signed)
Last Visit: 09/15/16 Next Visit: 01/29/17  Labs: 08/19/16 Mild decrease in GFR: 56 PLQ Eye Exam Summer 2017 WNL  Okay to refill PLQ?

## 2016-10-21 NOTE — Telephone Encounter (Signed)
ok 

## 2016-11-18 ENCOUNTER — Other Ambulatory Visit: Payer: Self-pay | Admitting: Rheumatology

## 2016-11-18 NOTE — Telephone Encounter (Signed)
09/15/16 last visit 01/30/17 next visit  CBC CMP due Neg TB gold 03/27/16 Will call patient

## 2016-11-18 NOTE — Telephone Encounter (Signed)
Called patient to advise labs due he can come by tomorrow am at 8.;30

## 2016-11-19 ENCOUNTER — Other Ambulatory Visit: Payer: Self-pay | Admitting: Radiology

## 2016-11-19 DIAGNOSIS — Z79899 Other long term (current) drug therapy: Secondary | ICD-10-CM

## 2016-11-19 LAB — CBC WITH DIFFERENTIAL/PLATELET
BASOS PCT: 1 %
Basophils Absolute: 47 cells/uL (ref 0–200)
Eosinophils Absolute: 376 cells/uL (ref 15–500)
Eosinophils Relative: 8 %
HEMATOCRIT: 42.5 % (ref 38.5–50.0)
Hemoglobin: 14.1 g/dL (ref 13.2–17.1)
LYMPHS ABS: 1410 {cells}/uL (ref 850–3900)
LYMPHS PCT: 30 %
MCH: 32.1 pg (ref 27.0–33.0)
MCHC: 33.2 g/dL (ref 32.0–36.0)
MCV: 96.8 fL (ref 80.0–100.0)
MONO ABS: 799 {cells}/uL (ref 200–950)
MPV: 9.7 fL (ref 7.5–12.5)
Monocytes Relative: 17 %
NEUTROS ABS: 2068 {cells}/uL (ref 1500–7800)
Neutrophils Relative %: 44 %
Platelets: 242 10*3/uL (ref 140–400)
RBC: 4.39 MIL/uL (ref 4.20–5.80)
RDW: 13.9 % (ref 11.0–15.0)
WBC: 4.7 10*3/uL (ref 3.8–10.8)

## 2016-11-19 LAB — COMPLETE METABOLIC PANEL WITH GFR
ALT: 20 U/L (ref 9–46)
AST: 29 U/L (ref 10–35)
Albumin: 3.7 g/dL (ref 3.6–5.1)
Alkaline Phosphatase: 49 U/L (ref 40–115)
BUN: 19 mg/dL (ref 7–25)
CHLORIDE: 102 mmol/L (ref 98–110)
CO2: 27 mmol/L (ref 20–31)
Calcium: 9.2 mg/dL (ref 8.6–10.3)
Creat: 1.22 mg/dL (ref 0.70–1.25)
GFR, EST AFRICAN AMERICAN: 69 mL/min (ref >=60)
GFR, EST NON AFRICAN AMERICAN: 60 mL/min (ref >=60)
Glucose, Bld: 91 mg/dL (ref 65–99)
Potassium: 5.3 mmol/L (ref 3.5–5.3)
Sodium: 138 mmol/L (ref 135–146)
TOTAL PROTEIN: 6.4 g/dL (ref 6.1–8.1)
Total Bilirubin: 0.3 mg/dL (ref 0.2–1.2)

## 2016-11-20 NOTE — Telephone Encounter (Signed)
ok 

## 2016-11-20 NOTE — Telephone Encounter (Signed)
09/15/16 last visit 01/30/17 next visit  Labs: 11/20/15 WNL Neg TB gold 03/27/16  Okay to refill Enbrel?

## 2017-01-22 NOTE — Progress Notes (Signed)
Office Visit Note  Patient: [name]             Date of Birth: Feb 19, 1946           MRN: [id number]             PCP: Donald Prose, MD Referring: Donald Prose, MD Visit Date: 01/29/2017 Occupation: @GUAROCC @    Subjective:  Medication management.   History of Present Illness: [name] is a 71 y.o. male with history of rheumatoid arthritis and osteoarthritis overlap. He states he is doing fairly well on current combination of medications. He underwent right [facility] reconstruction surgery by Dr. Amedeo Plenty and had good results. He was off medications for a month at the time of surgery. His arthritis did not flare during that time. He continues to have some discomfort in his left shoulder in his hands. Lower back pain persist. He cannot take any anti-inflammatories as he has peptic ulcer disease which was documented on the endoscopy. He has a discomfort in the morning and he has to take 1 tablet of tramadol in the morning to function area he takes a second dose of tramadol only if he has to do any strenuous activity.  Activities of Daily Living:  Patient reports morning stiffness for 15  minutes.   Patient Denies nocturnal pain.  Difficulty dressing/grooming: Denies Difficulty climbing stairs: Denies Difficulty getting out of chair: Denies Difficulty using hands for taps, buttons, cutlery, and/or writing: Reports   Review of Systems  Constitutional: Negative.  Negative for fatigue, night sweats and weakness ( ).  HENT: Negative.  Negative for mouth sores, mouth dryness and nose dryness.   Eyes: Negative.  Negative for redness and dryness.  Respiratory: Negative.  Negative for shortness of breath and difficulty breathing.   Cardiovascular: Negative.  Negative for chest pain, palpitations, hypertension, irregular heartbeat and swelling in legs/feet.  Gastrointestinal: Negative.  Negative for constipation and diarrhea.  Endocrine: Negative.  Negative for increased urination.    Genitourinary: Negative.   Musculoskeletal: Positive for arthralgias, joint pain and morning stiffness. Negative for joint swelling, myalgias, muscle weakness, muscle tenderness and myalgias.  Skin: Negative.  Negative for color change, rash, hair loss, nodules/bumps, skin tightness, ulcers and sensitivity to sunlight.  Allergic/Immunologic: Negative.  Negative for susceptible to infections.  Neurological: Negative.  Negative for dizziness, fainting, memory loss and night sweats.  Hematological: Negative.  Negative for swollen glands.  Psychiatric/Behavioral: Negative.  Negative for depressed mood and sleep disturbance. The patient is not nervous/anxious.     PMFS History:  Patient Active Problem List   Diagnosis Date Noted  . Rheumatoid arthritis of multiple sites with negative rheumatoid factor ([place]) 09/08/2016  . Primary osteoarthritis of both knees 09/08/2016  . History of left knee replacement 09/08/2016  . Primary osteoarthritis of both hands 09/08/2016  . DJD (degenerative joint disease), cervical 09/08/2016  . Spondylosis of lumbar region without myelopathy or radiculopathy 09/08/2016  . Primary insomnia 09/08/2016  . History of anxiety 09/08/2016  . High risk medication use 08/19/2016  . Atrial flutter with rapid ventricular response ([place]) 06/06/2012  . Substernal chest pain 06/06/2012  . Hypotension 06/06/2012  . Hypertension 06/06/2012    Past Medical History:  Diagnosis Date  . BPH (benign prostatic hyperplasia)   . Dysrhythmia    atrial flutter  . History of pneumonia   . HTN (hypertension)   . Hypertension   . IBS (irritable bowel syndrome)   . PUD (peptic ulcer disease)  Dr. Paulita Fujita  . Rheumatoid arthritis(714.0)    Dr. Estanislado Pandy    Family History  Problem Relation Age of Onset  . CAD Father        MI x 3 (1st 8s)  . Heart failure Father    Past Surgical History:  Procedure Laterality Date  . ABLATION OF DYSRHYTHMIC FOCUS  2013  . APPENDECTOMY    .  ATRIAL FLUTTER ABLATION N/A 07/19/2012   Procedure: ATRIAL FLUTTER ABLATION;  Surgeon: Evans Lance, MD;  Location: [facility] CATH LAB;  Service: Cardiovascular;  Laterality: N/A;  . CARDIOVERSION N/A 06/07/2012   Procedure: CARDIOVERSION;  Surgeon: Evans Lance, MD;  Location: [facility] CATH LAB;  Service: Cardiovascular;  Laterality: N/A;  . KNEE ARTHROSCOPY  2011   left  . LEFT HEART CATHETERIZATION WITH CORONARY ANGIOGRAM N/A 06/07/2012   Procedure: LEFT HEART CATHETERIZATION WITH CORONARY ANGIOGRAM;  Surgeon: Peter M Martinique, MD;  Location: [facility] CATH LAB;  Service: Cardiovascular;  Laterality: N/A;  . TOTAL KNEE ARTHROPLASTY  2012   left total knee   Social History   Social History Narrative  . No narrative on file     Objective: Vital Signs: Resp 12   Ht 5\' 10"  (1.778 m)   Wt 188 lb (85.3 kg)   BMI 26.98 kg/m    Physical Exam  Constitutional: He is oriented to person, place, and time. He appears well-developed and well-nourished.  HENT:  Head: Normocephalic and atraumatic.  Eyes: Pupils are equal, round, and reactive to light. Conjunctivae and EOM are normal.  Neck: Normal range of motion. Neck supple.  Cardiovascular: Normal rate, regular rhythm and normal heart sounds.   Pulmonary/Chest: Effort normal and breath sounds normal.  Abdominal: Soft. Bowel sounds are normal.  Neurological: He is alert and oriented to person, place, and time.  Skin: Skin is warm and dry. Capillary refill takes less than 2 seconds.  Psychiatric: He has a normal mood and affect. His behavior is normal.  Nursing note and vitals reviewed.    Musculoskeletal Exam: C-spine and thoracic lumbar spine good range of motion. Shoulder joints elbow joints wrist joints are good range of motion. He had thickening of bilateral DIP PIP joints with no synovitis. Hip joints are good range of motion. His left total knee replacement is doing well with limited extension. Right CMC surgery looks good without any warmth or  swelling.  CDAI Exam: CDAI Homunculus Exam:   Joint Counts:  CDAI Tender Joint count: 0 CDAI Swollen Joint count: 0  Global Assessments:  Patient Global Assessment: 2 Provider Global Assessment: 2  CDAI Calculated Score: 4    Investigation: Findings:  09/15/16 UDS and Narcotic agreement   12/2014 normal plaquenil eye exam   04/06/2016 negative TB gold   May 2018 CBC and CMP were normal  Imaging: No results found.  Speciality Comments: No specialty comments available.    Procedures:  No procedures performed Allergies: Sulfa antibiotics   Assessment / Plan:     Visit Diagnoses: Rheumatoid arthritis of multiple sites with negative rheumatoid factor ([place]): Seems to be very well controlled without any active synovitis. He does have some is stiffness in the morning and some discomfort with activities.  High risk medication use - Plaquenil 200 mg by mouth twice a day and Enbrel 50 mg subcutaneous every week. His labs are stable. We will continue to monitor labs every 3 months. His TB gold will be due with the next labs. He states she's been getting yearly eye exam  have advised him to forward the next eye exam papers to [place].  Primary osteoarthritis of both hands: Joint protection and muscle strengthening discussed.  Primary osteoarthritis of both knees: He is doing fairly well  Left total knee replacement: Doing well  Spondylosis of lumbar region without myelopathy or radiculopathy: Chronic pain  DJD (degenerative joint disease), cervical: Chronic pain  Chronic pain: He is been taking tramadol for pain management. Side effects were reviewed.  History of anxiety  Primary insomnia : Good sleep hygiene discussed.   Orders: Orders Placed This Encounter  Procedures  . Quantiferon tb gold assay (blood)   No orders of the defined types were placed in this encounter.   Face-to-face time spent with patient was 30 minutes. 50% of time was spent in counseling and  coordination of care.  Follow-Up Instructions: Return in about 5 months (around 07/01/2017) for Rheumatoid arthritis.   Bo Merino, MD  Note - This record has been created using Editor, commissioning.  Chart creation errors have been sought, but may not always  have been located. Such creation errors do not reflect on  the standard of medical care.

## 2017-01-29 ENCOUNTER — Encounter: Payer: Self-pay | Admitting: Rheumatology

## 2017-01-29 ENCOUNTER — Telehealth: Payer: Self-pay | Admitting: Pharmacist

## 2017-01-29 ENCOUNTER — Encounter: Payer: Self-pay | Admitting: Radiology

## 2017-01-29 ENCOUNTER — Ambulatory Visit (INDEPENDENT_AMBULATORY_CARE_PROVIDER_SITE_OTHER): Payer: Commercial Managed Care - PPO | Admitting: Rheumatology

## 2017-01-29 VITALS — Resp 12 | Ht 70.0 in | Wt 188.0 lb

## 2017-01-29 DIAGNOSIS — M47816 Spondylosis without myelopathy or radiculopathy, lumbar region: Secondary | ICD-10-CM | POA: Diagnosis not present

## 2017-01-29 DIAGNOSIS — M19042 Primary osteoarthritis, left hand: Secondary | ICD-10-CM

## 2017-01-29 DIAGNOSIS — Z79899 Other long term (current) drug therapy: Secondary | ICD-10-CM

## 2017-01-29 DIAGNOSIS — M0609 Rheumatoid arthritis without rheumatoid factor, multiple sites: Secondary | ICD-10-CM | POA: Diagnosis not present

## 2017-01-29 DIAGNOSIS — F5101 Primary insomnia: Secondary | ICD-10-CM

## 2017-01-29 DIAGNOSIS — Z8659 Personal history of other mental and behavioral disorders: Secondary | ICD-10-CM | POA: Diagnosis not present

## 2017-01-29 DIAGNOSIS — M47812 Spondylosis without myelopathy or radiculopathy, cervical region: Secondary | ICD-10-CM

## 2017-01-29 DIAGNOSIS — Z96652 Presence of left artificial knee joint: Secondary | ICD-10-CM | POA: Diagnosis not present

## 2017-01-29 DIAGNOSIS — M170 Bilateral primary osteoarthritis of knee: Secondary | ICD-10-CM | POA: Diagnosis not present

## 2017-01-29 DIAGNOSIS — M19041 Primary osteoarthritis, right hand: Secondary | ICD-10-CM | POA: Diagnosis not present

## 2017-01-29 DIAGNOSIS — M5030 Other cervical disc degeneration, unspecified cervical region: Secondary | ICD-10-CM | POA: Diagnosis not present

## 2017-01-29 NOTE — Telephone Encounter (Signed)
I called patient to discuss hydroxychloroquine eye exam.  Advised him that most recent eye exam in October 2017 was just for dry eyes.  Estill Bamberg at Dr. Ammie Ferrier office reports it has been since May 2017 since patient had hydroxychloroquine eye exam.    I advised patient he is past due for hydroxychloroquine eye exam.  Patient voiced understanding and will call Dr. Ammie Ferrier office to schedule an appointment.  I will mail him a hydroxychloroquine eye exam form for him to take with him to his appointment.    Elisabeth Most, Pharm.D., BCPS, CPP Clinical Pharmacist Pager: [phone] Phone: [phone] 01/29/2017 1:35 PM

## 2017-01-29 NOTE — Patient Instructions (Addendum)
Standing Labs We placed an order today for your standing lab work.    Please come back and get your standing labs in August and every 3 months TB Gold due with next lab  We have open lab Monday through Friday from 8:30-11:30 AM and 1:30-4 PM at the office of Dr. Bo Merino.   The office is located at [address] [place], [place], [place] [address] No appointment is necessary.   Labs are drawn by [facility].  You may receive a bill from [facility] for your lab work. If you have any questions regarding directions or hours of operation,  please call [phone].

## 2017-01-30 ENCOUNTER — Ambulatory Visit: Payer: Commercial Managed Care - PPO | Admitting: Rheumatology

## 2017-02-12 ENCOUNTER — Other Ambulatory Visit: Payer: Self-pay | Admitting: Rheumatology

## 2017-02-12 NOTE — Telephone Encounter (Signed)
Last Visit: 01/29/17 Next Visit: 07/03/17 Labs: 11/19/16 WNL TB Gold: 03/27/16 Neg  Okay to refill per Dr. Rob Hickman

## 2017-02-19 ENCOUNTER — Telehealth: Payer: Self-pay | Admitting: Rheumatology

## 2017-02-19 NOTE — Telephone Encounter (Signed)
Patient calling in reference to drug testing with insurance. Patient needs a letter sent in as in previous years. Insurance states they have not received anything. Please call patient to advise.

## 2017-02-19 NOTE — Telephone Encounter (Signed)
I mailed the letter.

## 2017-02-19 NOTE — Telephone Encounter (Signed)
Called pt I mailed the letter on July 12th, I have sent a copy to patient.

## 2017-02-23 ENCOUNTER — Other Ambulatory Visit: Payer: Self-pay | Admitting: Rheumatology

## 2017-02-23 NOTE — Telephone Encounter (Signed)
Last Visit: 01/29/17 Next Visit: 07/03/17 UDS: 09/17/16 Narc Agreement: 09/15/16 Last Fill: 08/21/16  Okay to refill Tramadol?

## 2017-02-23 NOTE — Telephone Encounter (Signed)
He should try cutting down to 1 tab po qhs .Ok to refill.

## 2017-02-23 NOTE — Telephone Encounter (Signed)
ok 

## 2017-02-23 NOTE — Telephone Encounter (Signed)
Last Visit: 01/29/17 Next Visit: 07/03/17  Okay to refill Xanax?  

## 2017-02-24 ENCOUNTER — Other Ambulatory Visit: Payer: Self-pay | Admitting: Rheumatology

## 2017-02-26 ENCOUNTER — Telehealth: Payer: Self-pay | Admitting: Rheumatology

## 2017-02-26 NOTE — Telephone Encounter (Signed)
Patient advised letter has been faxed. Patient will call the office if he has not received it.

## 2017-02-26 NOTE — Telephone Encounter (Signed)
Patient very upset because he is in collections due to not getting letter to insurance company in time. Patient very aggravated. Patient was advised letter has been mailed, but he has not received it yet, nor has company letter was sent to. Patient request a copy of letter be faxed to him at [phone]. If fax does not go thru patient would like to pick up a copy of letter at lunch time. Please call patient to discuss.

## 2017-03-05 ENCOUNTER — Other Ambulatory Visit: Payer: Self-pay

## 2017-03-05 DIAGNOSIS — Z79899 Other long term (current) drug therapy: Secondary | ICD-10-CM

## 2017-03-05 LAB — CBC WITH DIFFERENTIAL/PLATELET
BASOS ABS: 51 {cells}/uL (ref 0–200)
Basophils Relative: 1 %
EOS PCT: 6 %
Eosinophils Absolute: 306 cells/uL (ref 15–500)
HCT: 45.2 % (ref 38.5–50.0)
Hemoglobin: 15 g/dL (ref 13.2–17.1)
Lymphocytes Relative: 26 %
Lymphs Abs: 1326 cells/uL (ref 850–3900)
MCH: 32.4 pg (ref 27.0–33.0)
MCHC: 33.2 g/dL (ref 32.0–36.0)
MCV: 97.6 fL (ref 80.0–100.0)
MONOS PCT: 22 %
MPV: 9.7 fL (ref 7.5–12.5)
Monocytes Absolute: 1122 cells/uL — ABNORMAL HIGH (ref 200–950)
NEUTROS ABS: 2295 {cells}/uL (ref 1500–7800)
NEUTROS PCT: 45 %
PLATELETS: 261 10*3/uL (ref 140–400)
RBC: 4.63 MIL/uL (ref 4.20–5.80)
RDW: 13.8 % (ref 11.0–15.0)
WBC: 5.1 10*3/uL (ref 3.8–10.8)

## 2017-03-06 ENCOUNTER — Telehealth: Payer: Self-pay | Admitting: Radiology

## 2017-03-06 LAB — COMPLETE METABOLIC PANEL WITH GFR
ALBUMIN: 4.2 g/dL (ref 3.6–5.1)
ALK PHOS: 63 U/L (ref 40–115)
ALT: 17 U/L (ref 9–46)
AST: 19 U/L (ref 10–35)
BUN: 23 mg/dL (ref 7–25)
CALCIUM: 9.7 mg/dL (ref 8.6–10.3)
CHLORIDE: 103 mmol/L (ref 98–110)
CO2: 25 mmol/L (ref 20–32)
Creat: 1.19 mg/dL (ref 0.70–1.25)
GFR, EST NON AFRICAN AMERICAN: 62 mL/min (ref >=60)
GFR, Est African American: 72 mL/min (ref >=60)
Glucose, Bld: 102 mg/dL — ABNORMAL HIGH (ref 65–99)
POTASSIUM: 5.4 mmol/L — AB (ref 3.5–5.3)
SODIUM: 140 mmol/L (ref 135–146)
Total Bilirubin: 0.3 mg/dL (ref 0.2–1.2)
Total Protein: 6.7 g/dL (ref 6.1–8.1)

## 2017-03-06 NOTE — Telephone Encounter (Signed)
I have called patient to advise labs are stable  

## 2017-03-06 NOTE — Telephone Encounter (Signed)
-----   Message from Bo Merino, MD sent at 03/06/2017  8:32 AM EDT ----- Labs are stable

## 2017-03-06 NOTE — Progress Notes (Signed)
Labs are stable.

## 2017-03-09 LAB — QUANTIFERON TB GOLD ASSAY (BLOOD)
Interferon Gamma Release Assay: NEGATIVE
Mitogen-Nil: >10 IU/mL
QUANTIFERON NIL VALUE: 0.1 [IU]/mL
Quantiferon Tb Ag Minus Nil Value: <0 IU/mL

## 2017-03-09 NOTE — Progress Notes (Signed)
Elevated potassium due to hemolysis. Labs are stable otherwise.

## 2017-04-14 ENCOUNTER — Other Ambulatory Visit: Payer: Self-pay | Admitting: Rheumatology

## 2017-04-14 MED ORDER — TRAMADOL HCL 50 MG PO TABS
ORAL_TABLET | ORAL | 0 refills | Status: DC
Start: 2017-04-14 — End: 2017-05-27

## 2017-04-14 NOTE — Telephone Encounter (Signed)
ok 

## 2017-04-14 NOTE — Addendum Note (Signed)
Addended by: Carole Binning on: 04/14/2017 08:32 AM   Modules accepted: Orders

## 2017-04-14 NOTE — Telephone Encounter (Signed)
Last Visit: 01/29/17 Next Visit: 07/03/17 UDS: 09/17/16 Narc Agreement: 09/15/16 Last Fill: 02/23/17  Okay to refill Tramadol?

## 2017-04-14 NOTE — Telephone Encounter (Signed)
Patient is requesting refill of [facility]. Patient thought it was sent but the pharmacy does not have it.

## 2017-04-21 ENCOUNTER — Ambulatory Visit (INDEPENDENT_AMBULATORY_CARE_PROVIDER_SITE_OTHER): Payer: Commercial Managed Care - PPO | Admitting: Physical Medicine and Rehabilitation

## 2017-04-28 ENCOUNTER — Other Ambulatory Visit: Payer: Self-pay | Admitting: Rheumatology

## 2017-04-28 NOTE — Telephone Encounter (Signed)
Last Visit: 01/29/17 Next Visit: 07/03/17 Labs: 03/05/17 Stable TB Gold: 03/05/17 Neg  Okay to refill per Dr. Estanislado Pandy

## 2017-05-27 ENCOUNTER — Other Ambulatory Visit: Payer: Self-pay | Admitting: Rheumatology

## 2017-05-27 NOTE — Telephone Encounter (Signed)
ok 

## 2017-05-27 NOTE — Telephone Encounter (Signed)
Last Visit: 01/29/17 Next Visit: 07/03/17 UDS: 09/17/16 Narc Agreement: 09/15/16  Okay to refill Tramadol?

## 2017-05-29 ENCOUNTER — Telehealth: Payer: Self-pay

## 2017-05-29 NOTE — Telephone Encounter (Signed)
Patient states he needs the records from his visit on 09/15/16 and his labs sent to the insurance company in order have the UDS covered. Patient will come by at lunch time to sign a release of records.

## 2017-05-29 NOTE — Telephone Encounter (Signed)
Patient would like a call back at [phone].  Please advise.  Thank you.

## 2017-06-19 ENCOUNTER — Other Ambulatory Visit (INDEPENDENT_AMBULATORY_CARE_PROVIDER_SITE_OTHER): Payer: Self-pay

## 2017-06-19 NOTE — Telephone Encounter (Signed)
Patient calling concerning a Rx refill for Alprazolam.  Cb# is [phone].  Please advise.  Thank you.

## 2017-06-22 HISTORY — PX: NECK SURGERY: SHX720

## 2017-06-22 MED ORDER — ALPRAZOLAM 0.5 MG PO TABS: 1.0000 mg | ORAL_TABLET | Freq: Every day | ORAL | 2 refills | Status: DC

## 2017-06-22 NOTE — Progress Notes (Signed)
Office Visit Note  Patient: [name]             Date of Birth: 1945-12-29           MRN: [id number]             PCP: Donald Prose, MD Referring: Donald Prose, MD Visit Date: 07/03/2017 Occupation: @GUAROCC @    Subjective:  Arthritis (doing good, right finger pain )   History of Present Illness: [name] is a 71 y.o. male with history of rheumatoid arthritis and osteoarthritis. He states recently he is been having pain and discomfort in his right fifth finger PIP and DIP joint. None of the other joints are painful. He does have some underlying osteoarthritis as well. His left total knee replacement is doing well. Right he had some surgery on right knee as well in the past but it is not causing much discomfort currently. His distal disease and C-spine and lumbar spine is not causing much discomfort as well. He describes his pain level on the scale of 0-10 about 5 and the tramadol 0.  Activities of Daily Living:  Patient reports morning stiffness for26minutes.   Patient Denies nocturnal pain.  Difficulty dressing/grooming: Denies Difficulty climbing stairs: Denies Difficulty getting out of chair: Denies Difficulty using hands for taps, buttons, cutlery, and/or writing: Denies   Review of Systems  Constitutional: Negative for fatigue, night sweats and weakness ( ).  HENT: Negative for mouth sores, mouth dryness and nose dryness.   Eyes: Positive for dryness. Negative for redness.  Respiratory: Negative for shortness of breath and difficulty breathing.   Cardiovascular: Positive for hypertension. Negative for chest pain, palpitations, irregular heartbeat and swelling in legs/feet.  Gastrointestinal: Negative for constipation and diarrhea.  Endocrine: Negative for increased urination.  Musculoskeletal: Positive for arthralgias, joint pain and morning stiffness. Negative for joint swelling, myalgias, muscle weakness, muscle tenderness and myalgias.  Skin: Negative for  color change, rash, hair loss, nodules/bumps, skin tightness, ulcers and sensitivity to sunlight.  Allergic/Immunologic: Negative for susceptible to infections.  Neurological: Negative for dizziness, fainting, memory loss and night sweats.  Hematological: Negative for swollen glands.  Psychiatric/Behavioral: Negative for depressed mood and sleep disturbance. The patient is nervous/anxious.     PMFS History:  Patient Active Problem List   Diagnosis Date Noted  . Rheumatoid arthritis of multiple sites with negative rheumatoid factor ([place]) 09/08/2016  . Primary osteoarthritis of both knees 09/08/2016  . History of left knee replacement 09/08/2016  . Primary osteoarthritis of both hands 09/08/2016  . DJD (degenerative joint disease), cervical 09/08/2016  . Spondylosis of lumbar region without myelopathy or radiculopathy 09/08/2016  . Primary insomnia 09/08/2016  . History of anxiety 09/08/2016  . High risk medication use 08/19/2016  . Atrial flutter with rapid ventricular response ([place]) 06/06/2012  . Substernal chest pain 06/06/2012  . Hypotension 06/06/2012  . Hypertension 06/06/2012    Past Medical History:  Diagnosis Date  . BPH (benign prostatic hyperplasia)   . Dysrhythmia    atrial flutter  . History of pneumonia   . HTN (hypertension)   . Hypertension   . IBS (irritable bowel syndrome)   . PUD (peptic ulcer disease)    Dr. Paulita Fujita  . Rheumatoid arthritis(714.0)    Dr. Estanislado Pandy    Family History  Problem Relation Age of Onset  . CAD Father        MI x 3 (1st 81s)  . Heart failure Father   . Epilepsy Daughter  Past Surgical History:  Procedure Laterality Date  . ABLATION OF DYSRHYTHMIC FOCUS  2013  . APPENDECTOMY    . ATRIAL FLUTTER ABLATION N/A 07/19/2012   Procedure: ATRIAL FLUTTER ABLATION;  Surgeon: Evans Lance, MD;  Location: [facility] CATH LAB;  Service: Cardiovascular;  Laterality: N/A;  . CARDIOVERSION N/A 06/07/2012   Procedure: CARDIOVERSION;  Surgeon:  Evans Lance, MD;  Location: [facility] CATH LAB;  Service: Cardiovascular;  Laterality: N/A;  . KNEE ARTHROSCOPY  2011   left  . LEFT HEART CATHETERIZATION WITH CORONARY ANGIOGRAM N/A 06/07/2012   Procedure: LEFT HEART CATHETERIZATION WITH CORONARY ANGIOGRAM;  Surgeon: Peter M Martinique, MD;  Location: [facility] CATH LAB;  Service: Cardiovascular;  Laterality: N/A;  . NECK SURGERY  06/22/2017   melanoma removal   . TOTAL KNEE ARTHROPLASTY  2012   left total knee   Social History   Social History Narrative  . Not on file     Objective: Vital Signs: BP 129/80 (BP Location: Left Arm, Patient Position: Sitting, Cuff Size: Normal)   Pulse 84   Resp 17   Ht 5\' 10"  (1.778 m)   Wt 190 lb (86.2 kg)   BMI 27.26 kg/m    Physical Exam  Constitutional: He is oriented to person, place, and time. He appears well-developed and well-nourished.  HENT:  Head: Normocephalic and atraumatic.  Eyes: Conjunctivae and EOM are normal. Pupils are equal, round, and reactive to light.  Neck: Normal range of motion. Neck supple.  Cardiovascular: Normal rate, regular rhythm and normal heart sounds.  Pulmonary/Chest: Effort normal and breath sounds normal.  Abdominal: Soft. Bowel sounds are normal.  Neurological: He is alert and oriented to person, place, and time.  Skin: Skin is warm and dry. Capillary refill takes less than 2 seconds.  Psychiatric: He has a normal mood and affect. His behavior is normal.  Nursing note and vitals reviewed.    Musculoskeletal Exam: C-spine and thoracic lumbar spine good range of motion. Shoulder joints elbow joints wrist joints are good range of motion. He has some cleaning of PIP/DIP joints with no synovitis. He has some tenderness over right fifth PIP and DIP joint with no synovitis. Hip joints with good range of motion. His left total knee replacement is doing well without any warmth swelling or effusion. Ankle joints are good range of motion with no synovitis.  CDAI Exam: CDAI  Homunculus Exam:   Tenderness:  Right hand: 5th PIP and 5th DIP  Joint Counts:  CDAI Tender Joint count: 1 CDAI Swollen Joint count: 0  Global Assessments:  Patient Global Assessment: 1 Provider Global Assessment: 1  CDAI Calculated Score: 3    Investigation: No additional findings.PLQ eye exam: 03/17/2017 UDS: 09/15/2016 Narc agreement: 09/15/2016 CBC Latest Ref Rng & Units 03/05/2017 11/19/2016 08/19/2016  WBC 3.8 - 10.8 K/uL 5.1 4.7 4.7  Hemoglobin 13.2 - 17.1 g/dL 15.0 14.1 14.5  Hematocrit 38.5 - 50.0 % 45.2 42.5 43.5  Platelets 140 - 400 K/uL 261 242 224   CMP Latest Ref Rng & Units 03/05/2017 11/19/2016 08/19/2016  Glucose 65 - 99 mg/dL 102(H) 91 77  BUN 7 - 25 mg/dL 23 19 19   Creatinine 0.70 - 1.25 mg/dL 1.19 1.22 1.29(H)  Sodium 135 - 146 mmol/L 140 138 137  Potassium 3.5 - 5.3 mmol/L 5.4(H) 5.3 4.6  Chloride 98 - 110 mmol/L 103 102 101  CO2 20 - 32 mmol/L 25 27 28   Calcium 8.6 - 10.3 mg/dL 9.7 9.2 9.2  Total Protein  6.1 - 8.1 g/dL 6.7 6.4 6.8  Total Bilirubin 0.2 - 1.2 mg/dL 0.3 0.3 0.5  Alkaline Phos 40 - 115 U/L 63 49 57  AST 10 - 35 U/L 19 29 21   ALT 9 - 46 U/L 17 20 15     Imaging: No results found.  Speciality Comments: No specialty comments available.    Procedures:  No procedures performed Allergies: Sulfa antibiotics   Assessment / Plan:     Visit Diagnoses: Rheumatoid arthritis of multiple sites with negative rheumatoid factor ([place]): He has no active synovitis. He's been having some arthralgias which he relates to packing and moving some stuff.  High risk medication use - Plaquenil 200 mg by mouth twice a day and Enbrel 50 mg subcutaneous every [place] exam: 03/17/2017. His labs have been stable. We will check his labs today and then every 3 months to monitor for drug toxicity.  Primary osteoarthritis of both hands: Chronic pain  Primary osteoarthritis of right knee: He is been having some discomfort.  History of left knee replacement: His left  total hip replacement is doing well.  Spondylosis of lumbar region without myelopathy or radiculopathy: Doing well  DDD (degenerative disc disease), cervical: Doing well  Other chronic pain - TramadolUDS: 2/26/2018Narc agreement: 09/15/2016  Primary insomnia:  better with medications  History of anxiety : He is on Xanax which is been helpful.   Orders: No orders of the defined types were placed in this encounter.  No orders of the defined types were placed in this encounter.   Face-to-face time spent with patient was 30 minutes. Greater than 50% of time was spent in counseling and coordination of care.  Follow-Up Instructions: Return in about 5 months (around 12/01/2017) for Rheumatoid arthritis, Osteoarthritis,DDD.   Bo Merino, MD  Note - This record has been created using Editor, commissioning.  Chart creation errors have been sought, but may not always  have been located. Such creation errors do not reflect on  the standard of medical care.

## 2017-06-22 NOTE — Telephone Encounter (Signed)
Patient left a message inquiring about his medication refill.

## 2017-06-22 NOTE — Addendum Note (Signed)
Addended by: Carole Binning on: 06/22/2017 10:00 AM   Modules accepted: Orders

## 2017-06-22 NOTE — Telephone Encounter (Signed)
Last Visit: 01/29/17 Next Visit: 07/03/17  Okay to refill Xanax?

## 2017-06-22 NOTE — Telephone Encounter (Signed)
ok 

## 2017-07-03 ENCOUNTER — Encounter: Payer: Self-pay | Admitting: Rheumatology

## 2017-07-03 ENCOUNTER — Ambulatory Visit: Payer: Commercial Managed Care - PPO | Admitting: Rheumatology

## 2017-07-03 ENCOUNTER — Other Ambulatory Visit: Payer: Self-pay | Admitting: Rheumatology

## 2017-07-03 VITALS — BP 129/80 | HR 84 | Resp 17 | Ht 70.0 in | Wt 190.0 lb

## 2017-07-03 DIAGNOSIS — M19042 Primary osteoarthritis, left hand: Secondary | ICD-10-CM

## 2017-07-03 DIAGNOSIS — M5030 Other cervical disc degeneration, unspecified cervical region: Secondary | ICD-10-CM

## 2017-07-03 DIAGNOSIS — F5101 Primary insomnia: Secondary | ICD-10-CM

## 2017-07-03 DIAGNOSIS — Z96652 Presence of left artificial knee joint: Secondary | ICD-10-CM

## 2017-07-03 DIAGNOSIS — M47816 Spondylosis without myelopathy or radiculopathy, lumbar region: Secondary | ICD-10-CM

## 2017-07-03 DIAGNOSIS — M0609 Rheumatoid arthritis without rheumatoid factor, multiple sites: Secondary | ICD-10-CM

## 2017-07-03 DIAGNOSIS — M1711 Unilateral primary osteoarthritis, right knee: Secondary | ICD-10-CM | POA: Diagnosis not present

## 2017-07-03 DIAGNOSIS — M19041 Primary osteoarthritis, right hand: Secondary | ICD-10-CM | POA: Diagnosis not present

## 2017-07-03 DIAGNOSIS — Z8659 Personal history of other mental and behavioral disorders: Secondary | ICD-10-CM

## 2017-07-03 DIAGNOSIS — G8929 Other chronic pain: Secondary | ICD-10-CM

## 2017-07-03 DIAGNOSIS — Z79899 Other long term (current) drug therapy: Secondary | ICD-10-CM

## 2017-07-03 LAB — CBC WITH DIFFERENTIAL/PLATELET
BASOS PCT: 1.6 %
Basophils Absolute: 80 cells/uL (ref 0–200)
EOS ABS: 290 {cells}/uL (ref 15–500)
Eosinophils Relative: 5.8 %
HCT: 44.6 % (ref 38.5–50.0)
Hemoglobin: 14.9 g/dL (ref 13.2–17.1)
Lymphs Abs: 1175 cells/uL (ref 850–3900)
MCH: 31.4 pg (ref 27.0–33.0)
MCHC: 33.4 g/dL (ref 32.0–36.0)
MCV: 93.9 fL (ref 80.0–100.0)
MONOS PCT: 14.5 %
MPV: 10.3 fL (ref 7.5–12.5)
Neutro Abs: 2730 cells/uL (ref 1500–7800)
Neutrophils Relative %: 54.6 %
PLATELETS: 265 10*3/uL (ref 140–400)
RBC: 4.75 10*6/uL (ref 4.20–5.80)
RDW: 13.3 % (ref 11.0–15.0)
TOTAL LYMPHOCYTE: 23.5 %
WBC mixed population: 725 cells/uL (ref 200–950)
WBC: 5 10*3/uL (ref 3.8–10.8)

## 2017-07-03 LAB — COMPLETE METABOLIC PANEL WITH GFR
AG Ratio: 1.8 (calc) (ref 1.0–2.5)
ALKALINE PHOSPHATASE (APISO): 59 U/L (ref 40–115)
ALT: 21 U/L (ref 9–46)
AST: 26 U/L (ref 10–35)
Albumin: 4.4 g/dL (ref 3.6–5.1)
BILIRUBIN TOTAL: 0.5 mg/dL (ref 0.2–1.2)
BUN: 19 mg/dL (ref 7–25)
CHLORIDE: 104 mmol/L (ref 98–110)
CO2: 28 mmol/L (ref 20–32)
Calcium: 9.5 mg/dL (ref 8.6–10.3)
Creat: 1.06 mg/dL (ref 0.70–1.18)
GFR, Est African American: 82 mL/min/{1.73_m2} (ref >=60)
GFR, Est Non African American: 71 mL/min/{1.73_m2} (ref >=60)
GLUCOSE: 78 mg/dL (ref 65–99)
Globulin: 2.5 g/dL (calc) (ref 1.9–3.7)
Potassium: 4.8 mmol/L (ref 3.5–5.3)
Sodium: 141 mmol/L (ref 135–146)
TOTAL PROTEIN: 6.9 g/dL (ref 6.1–8.1)

## 2017-07-03 NOTE — Telephone Encounter (Signed)
Patient would like a Rx refill on Tramadol.  [facility]  Please advise.  Thank you.

## 2017-07-03 NOTE — Patient Instructions (Signed)
Standing Labs We placed an order today for your standing lab work.    Please come back and get your standing labs in March and every 3 months  We have open lab Monday through Friday from 8:30-11:30 AM and 1:30-4 PM at the office of Dr. Jhace Fennell.   The office is located at [address], [address], [place], [place] [address] No appointment is necessary.   Labs are drawn by [facility].  You may receive a bill from [facility] for your lab work. If you have any questions regarding directions or hours of operation,  please call [phone].    

## 2017-07-03 NOTE — Telephone Encounter (Signed)
ok 

## 2017-07-03 NOTE — Telephone Encounter (Signed)
Last Visit: 07/03/17 Next Visit: 12/04/17 UDS: 09/15/16  Narc Agreement :09/15/16  Okay to refill Tramadol?

## 2017-07-21 ENCOUNTER — Other Ambulatory Visit: Payer: Self-pay | Admitting: Rheumatology

## 2017-07-22 NOTE — Telephone Encounter (Signed)
Last Visit: 07/03/17 Next Visit: 12/04/17 Labs: 07/03/17 WNL TB Gold: 03/05/17 Neg   Okay to refill per Dr. Estanislado Pandy

## 2017-09-17 ENCOUNTER — Other Ambulatory Visit: Payer: Self-pay | Admitting: Rheumatology

## 2017-09-17 NOTE — Telephone Encounter (Signed)
Last visit: 07/03/2017 Next visit: 12/04/2017  Last fill: 06/22/2017 90 day supply   Okay to refill xanax?

## 2017-09-17 NOTE — Telephone Encounter (Signed)
ok 

## 2017-10-08 ENCOUNTER — Other Ambulatory Visit: Payer: Self-pay

## 2017-10-08 ENCOUNTER — Other Ambulatory Visit: Payer: Self-pay | Admitting: Rheumatology

## 2017-10-08 ENCOUNTER — Other Ambulatory Visit: Payer: Self-pay | Admitting: *Deleted

## 2017-10-08 DIAGNOSIS — Z79899 Other long term (current) drug therapy: Secondary | ICD-10-CM

## 2017-10-08 NOTE — Telephone Encounter (Signed)
Last visit: 07/03/2017 Next visit: 12/04/2017 Labs: 07/03/18 WNL TB Gold: 03/05/17 Neg   Patient will update labs today.   Okay to refill per Dr. Estanislado Pandy

## 2017-10-16 ENCOUNTER — Other Ambulatory Visit: Payer: Self-pay

## 2017-10-16 DIAGNOSIS — Z79899 Other long term (current) drug therapy: Secondary | ICD-10-CM

## 2017-10-16 LAB — CBC WITH DIFFERENTIAL/PLATELET
Basophils Absolute: 99 {cells}/uL (ref 0–200)
Basophils Relative: 2.2 %
Eosinophils Absolute: 473 {cells}/uL (ref 15–500)
Eosinophils Relative: 10.5 %
HCT: 43.1 % (ref 38.5–50.0)
Hemoglobin: 14.4 g/dL (ref 13.2–17.1)
Lymphs Abs: 1445 {cells}/uL (ref 850–3900)
MCH: 31 pg (ref 27.0–33.0)
MCHC: 33.4 g/dL (ref 32.0–36.0)
MCV: 92.9 fL (ref 80.0–100.0)
MPV: 10.6 fL (ref 7.5–12.5)
Monocytes Relative: 15 %
Neutro Abs: 1809 {cells}/uL (ref 1500–7800)
Neutrophils Relative %: 40.2 %
Platelets: 253 10*3/uL (ref 140–400)
RBC: 4.64 Million/uL (ref 4.20–5.80)
RDW: 13.1 % (ref 11.0–15.0)
Total Lymphocyte: 32.1 %
WBC mixed population: 675 {cells}/uL (ref 200–950)
WBC: 4.5 10*3/uL (ref 3.8–10.8)

## 2017-10-16 LAB — COMPLETE METABOLIC PANEL WITH GFR
AG Ratio: 1.7 (calc) (ref 1.0–2.5)
ALBUMIN MSPROF: 4.2 g/dL (ref 3.6–5.1)
ALT: 19 U/L (ref 9–46)
AST: 27 U/L (ref 10–35)
Alkaline phosphatase (APISO): 52 U/L (ref 40–115)
BUN / CREAT RATIO: 16 (calc) (ref 6–22)
BUN: 20 mg/dL (ref 7–25)
CALCIUM: 9.4 mg/dL (ref 8.6–10.3)
CO2: 32 mmol/L (ref 20–32)
CREATININE: 1.25 mg/dL — AB (ref 0.70–1.18)
Chloride: 101 mmol/L (ref 98–110)
GFR, EST AFRICAN AMERICAN: 67 mL/min/{1.73_m2} (ref >=60)
GFR, EST NON AFRICAN AMERICAN: 58 mL/min/{1.73_m2} — AB (ref >=60)
GLUCOSE: 88 mg/dL (ref 65–99)
Globulin: 2.5 g/dL (calc) (ref 1.9–3.7)
Potassium: 4.6 mmol/L (ref 3.5–5.3)
Sodium: 139 mmol/L (ref 135–146)
TOTAL PROTEIN: 6.7 g/dL (ref 6.1–8.1)
Total Bilirubin: 0.4 mg/dL (ref 0.2–1.2)

## 2017-10-19 NOTE — Progress Notes (Signed)
Elevation in creatinine noted.  Most likely related to use of lisinopril HCTZ.  Please notify patient and forward results to his PCP.

## 2017-10-22 ENCOUNTER — Other Ambulatory Visit: Payer: Self-pay | Admitting: Rheumatology

## 2017-10-22 NOTE — Telephone Encounter (Signed)
Last visit: 07/03/2017 Next visit: 12/04/2017 Labs: 10/16/17 Elevation in creatinine noted. Most likely related to use of lisinopril HCTZ PLQ Eye Exam: 03/17/2017 WNL   Okay to refill per Dr. Estanislado Pandy

## 2017-11-20 NOTE — Progress Notes (Signed)
Office Visit Note  Patient: [name]             Date of Birth: [birth year]           MRN: [id number]             PCP: Donald Prose, MD Referring: Donald Prose, MD Visit Date: 12/04/2017 Occupation: @GUAROCC @    Subjective Medication management.   History of Present Illness: [name] is a 72 y.o. male with history of rheumatoid arthritis and osteoarthritis overlap.  He states he has been experiencing some stiffness in his hands.  There has been no joint swelling.  The left total knee replacement is doing well.  He is not having much discomfort in his right knee.  The neck is stiffness persist.  Patient states that he would be moving to [place] in August or September.  Activities of Daily Living:  Patient reports morning stiffness for 2 minute.   Patient Denies nocturnal pain.  Difficulty dressing/grooming: Denies Difficulty climbing stairs: Denies Difficulty getting out of chair: Denies Difficulty using hands for taps, buttons, cutlery, and/or writing: Denies   Review of Systems  Constitutional: Negative for fatigue and night sweats.  HENT: Negative for mouth sores, mouth dryness and nose dryness.   Eyes: Negative for redness and dryness.  Respiratory: Negative for shortness of breath and difficulty breathing.   Cardiovascular: Negative for chest pain, palpitations, hypertension, irregular heartbeat and swelling in legs/feet.  Gastrointestinal: Negative for constipation and diarrhea.  Endocrine: Negative for increased urination.  Musculoskeletal: Positive for arthralgias, joint pain and morning stiffness. Negative for joint swelling, myalgias, muscle weakness, muscle tenderness and myalgias.  Skin: Negative for color change, rash, hair loss, nodules/bumps, skin tightness, ulcers and sensitivity to sunlight.  Allergic/Immunologic: Negative for susceptible to infections.  Neurological: Negative for dizziness, fainting, memory loss, night sweats and weakness  ( ).  Hematological: Negative for swollen glands.  Psychiatric/Behavioral: Negative for depressed mood and sleep disturbance. The patient is not nervous/anxious.     PMFS History:  Patient Active Problem List   Diagnosis Date Noted  . Rheumatoid arthritis of multiple sites with negative rheumatoid factor ([place]) 09/08/2016  . Primary osteoarthritis of both knees 09/08/2016  . History of left knee replacement 09/08/2016  . Primary osteoarthritis of both hands 09/08/2016  . DJD (degenerative joint disease), cervical 09/08/2016  . Spondylosis of lumbar region without myelopathy or radiculopathy 09/08/2016  . Primary insomnia 09/08/2016  . History of anxiety 09/08/2016  . High risk medication use 08/19/2016  . Atrial flutter with rapid ventricular response ([place]) 06/06/2012  . Substernal chest pain 06/06/2012  . Hypotension 06/06/2012  . Hypertension 06/06/2012    Past Medical History:  Diagnosis Date  . BPH (benign prostatic hyperplasia)   . Dysrhythmia    atrial flutter  . History of pneumonia   . HTN (hypertension)   . Hypertension   . IBS (irritable bowel syndrome)   . PUD (peptic ulcer disease)    Dr. Paulita Fujita  . Rheumatoid arthritis(714.0)    Dr. Estanislado Pandy    Family History  Problem Relation Age of Onset  . CAD Father        MI x 3 (1st 71s)  . Heart failure Father   . Epilepsy Daughter    Past Surgical History:  Procedure Laterality Date  . ABLATION OF DYSRHYTHMIC FOCUS  2013  . APPENDECTOMY    . ATRIAL FLUTTER ABLATION N/A 07/19/2012   Procedure: ATRIAL FLUTTER ABLATION;  Surgeon: Champ Mungo  Lovena Le, MD;  Location: [facility] CATH LAB;  Service: Cardiovascular;  Laterality: N/A;  . CARDIOVERSION N/A 06/07/2012   Procedure: CARDIOVERSION;  Surgeon: Evans Lance, MD;  Location: [facility] CATH LAB;  Service: Cardiovascular;  Laterality: N/A;  . KNEE ARTHROSCOPY  2011   left  . LEFT HEART CATHETERIZATION WITH CORONARY ANGIOGRAM N/A 06/07/2012   Procedure: LEFT HEART CATHETERIZATION  WITH CORONARY ANGIOGRAM;  Surgeon: Peter M Martinique, MD;  Location: [facility] CATH LAB;  Service: Cardiovascular;  Laterality: N/A;  . NECK SURGERY  06/22/2017   melanoma removal   . TOTAL KNEE ARTHROPLASTY  2012   left total knee   Social History   Social History Narrative  . Not on file     Objective: Vital Signs: BP 120/74 (BP Location: Left Arm, Patient Position: Sitting, Cuff Size: Normal)   Pulse 83   Resp 15   Ht 5\' 10"  (1.778 m)   Wt 186 lb (84.4 kg)   BMI 26.69 kg/m    Physical Exam  Constitutional: He is oriented to person, place, and time. He appears well-developed and well-nourished.  HENT:  Head: Normocephalic and atraumatic.  Eyes: Pupils are equal, round, and reactive to light. Conjunctivae and EOM are normal.  Neck: Normal range of motion. Neck supple.  Cardiovascular: Normal rate, regular rhythm and normal heart sounds.  Pulmonary/Chest: Effort normal and breath sounds normal.  Abdominal: Soft. Bowel sounds are normal.  Neurological: He is alert and oriented to person, place, and time.  Skin: Skin is warm and dry. Capillary refill takes less than 2 seconds.  Psychiatric: He has a normal mood and affect. His behavior is normal.  Nursing note and vitals reviewed.    Musculoskeletal Exam: C-spine limited range of motion.  Shoulder joints elbow joints wrist joints are good range of motion.  He had no active synovitis on examination.  He had DIP and PIP thickening in his bilateral hands.  Hip joints were in good range of motion.  His left total knee replacement is doing well.  It crepitus in his right knee joint.  CDAI Exam: CDAI Homunculus Exam:   Joint Counts:  CDAI Tender Joint count: 0 CDAI Swollen Joint count: 0  Global Assessments:  Patient Global Assessment: 2 Provider Global Assessment: 2  CDAI Calculated Score: 4    Investigation: No additional findings.TB Gold: 03/05/2017 Negative  CBC Latest Ref Rng & Units 10/16/2017 07/03/2017 03/05/2017  WBC 3.8  - 10.8 Thousand/uL 4.5 5.0 5.1  Hemoglobin 13.2 - 17.1 g/dL 14.4 14.9 15.0  Hematocrit 38.5 - 50.0 % 43.1 44.6 45.2  Platelets 140 - 400 Thousand/uL 253 265 261   CMP Latest Ref Rng & Units 10/16/2017 07/03/2017 03/05/2017  Glucose 65 - 99 mg/dL 88 78 102(H)  BUN 7 - 25 mg/dL 20 19 23   Creatinine 0.70 - 1.18 mg/dL 1.25(H) 1.06 1.19  Sodium 135 - 146 mmol/L 139 141 140  Potassium 3.5 - 5.3 mmol/L 4.6 4.8 5.4(H)  Chloride 98 - 110 mmol/L 101 104 103  CO2 20 - 32 mmol/L 32 28 25  Calcium 8.6 - 10.3 mg/dL 9.4 9.5 9.7  Total Protein 6.1 - 8.1 g/dL 6.7 6.9 6.7  Total Bilirubin 0.2 - 1.2 mg/dL 0.4 0.5 0.3  Alkaline Phos 40 - 115 U/L - - 63  AST 10 - 35 U/L 27 26 19   ALT 9 - 46 U/L 19 21 17     Imaging: No results found.  Speciality Comments: No specialty comments available.    Procedures:  No procedures performed Allergies: Sulfa antibiotics   Assessment / Plan:     Visit Diagnoses: Rheumatoid arthritis of multiple sites with negative rheumatoid factor (HCC)-synovitis on examination.  He has mild arthralgias intermittently.  I believe most of the joint pain is related to osteoarthritis.  High risk medication use - Plaquenil 200 mg by mouth twice a day and Enbrel 50 mg subcutaneous every weekeye exam.  His labs have been stable.  We will continue to monitor labs every 3 months.  We also discussed about tapering Plaquenil and eventually stopping it.  Primary osteoarthritis of both hands-joint protection muscle strengthening was discussed.  He requested a prescription for Voltaren gel which was given today.  Primary osteoarthritis of right knee-he is doing fairly well.  History of left knee replacement-he has limited range of motion but not much discomfort.  Spondylosis of lumbar region without myelopathy or radiculopathy-he continues to have some lower back pain.  DDD (degenerative disc disease), cervical-he has limited range of motion of his cervical spine.  History of anxiety -  Xanax 0.5 mg twice daily PRN.  He states is unable to sleep at night without it.  And he also has to take it during daytime for anxiety.  Tapering Xanax was discussed.  Primary insomnia-good sleep hygiene was discussed.  Other chronic pain - tramadol.  Patient plans to discontinue tramadol.   Orders: No orders of the defined types were placed in this encounter.  Meds ordered this encounter  Medications  . diclofenac sodium (VOLTAREN) 1 % GEL    Sig: Apply 3 grams to 3 large joints, up to 3 times daily as needed.    Dispense:  3 Tube    Refill:  3    Face-to-face time spent with patient was 30 minutes. >50% of time was spent in counseling and coordination of care.  Follow-Up Instructions: Return in about 4 months (around 04/06/2018) for Rheumatoid arthritis, Osteoarthritis.   Bo Merino, MD  Note - This record has been created using Editor, commissioning.  Chart creation errors have been sought, but may not always  have been located. Such creation errors do not reflect on  the standard of medical care.

## 2017-12-04 ENCOUNTER — Encounter: Payer: Self-pay | Admitting: Rheumatology

## 2017-12-04 ENCOUNTER — Ambulatory Visit (INDEPENDENT_AMBULATORY_CARE_PROVIDER_SITE_OTHER): Payer: Commercial Managed Care - PPO | Admitting: Rheumatology

## 2017-12-04 ENCOUNTER — Telehealth: Payer: Self-pay

## 2017-12-04 VITALS — BP 120/74 | HR 83 | Resp 15 | Ht 70.0 in | Wt 186.0 lb

## 2017-12-04 DIAGNOSIS — M1711 Unilateral primary osteoarthritis, right knee: Secondary | ICD-10-CM | POA: Diagnosis not present

## 2017-12-04 DIAGNOSIS — M5030 Other cervical disc degeneration, unspecified cervical region: Secondary | ICD-10-CM | POA: Diagnosis not present

## 2017-12-04 DIAGNOSIS — Z8659 Personal history of other mental and behavioral disorders: Secondary | ICD-10-CM | POA: Diagnosis not present

## 2017-12-04 DIAGNOSIS — M19041 Primary osteoarthritis, right hand: Secondary | ICD-10-CM | POA: Diagnosis not present

## 2017-12-04 DIAGNOSIS — G8929 Other chronic pain: Secondary | ICD-10-CM | POA: Diagnosis not present

## 2017-12-04 DIAGNOSIS — M47816 Spondylosis without myelopathy or radiculopathy, lumbar region: Secondary | ICD-10-CM | POA: Diagnosis not present

## 2017-12-04 DIAGNOSIS — F5101 Primary insomnia: Secondary | ICD-10-CM | POA: Diagnosis not present

## 2017-12-04 DIAGNOSIS — M19042 Primary osteoarthritis, left hand: Secondary | ICD-10-CM

## 2017-12-04 DIAGNOSIS — Z96652 Presence of left artificial knee joint: Secondary | ICD-10-CM

## 2017-12-04 DIAGNOSIS — Z79899 Other long term (current) drug therapy: Secondary | ICD-10-CM

## 2017-12-04 DIAGNOSIS — M0609 Rheumatoid arthritis without rheumatoid factor, multiple sites: Secondary | ICD-10-CM | POA: Diagnosis not present

## 2017-12-04 MED ORDER — DICLOFENAC SODIUM 1 % TD GEL
TRANSDERMAL | 3 refills | Status: AC
Start: 2017-12-04 — End: ?

## 2017-12-04 NOTE — Patient Instructions (Signed)
Standing Labs We placed an order today for your standing lab work.    Please come back and get your standing labs in June and every 3 months  We have open lab Monday through Friday from 8:30-11:30 AM and 1:30-4:00 PM  at the office of Dr. Ostin Mathey.   You may experience shorter wait times on Monday and Friday afternoons. The office is located at [address], [address], [place], [place] [address] No appointment is necessary.   Labs are drawn by [facility].  You may receive a bill from [facility] for your lab work. If you have any questions regarding directions or hours of operation,  please call [phone].    

## 2017-12-04 NOTE — Telephone Encounter (Signed)
Received a prior authorization request for Diclofenac Gel. Authorization was submitted to pts insurance via cover my meds.   Received a confirmation from [employer] My Meds regarding a prior authorization approval for Voltaren Gel from 11/04/2017 to 12/04/2018.   Reference number:  [id number] Phone number:none  Will send document to scan center.  Called pt to update. Left voice message.   [place]a [place], [place], CPhT 12:59 PM

## 2017-12-15 ENCOUNTER — Other Ambulatory Visit: Payer: Self-pay | Admitting: Rheumatology

## 2017-12-15 NOTE — Telephone Encounter (Signed)
ok 

## 2017-12-15 NOTE — Telephone Encounter (Signed)
Last visit: 12/04/2017 Next visit: 04/06/2018  Okay to refill xanax?

## 2017-12-23 ENCOUNTER — Other Ambulatory Visit: Payer: Self-pay | Admitting: Rheumatology

## 2017-12-23 NOTE — Telephone Encounter (Signed)
Last visit: 12/04/2017 Next visit: 04/06/2018 Labs: 10/16/17 Elevation in creatinine noted TB Gold: 03/05/17 Neg   Okay to refill per Dr. Estanislado Pandy

## 2018-01-13 ENCOUNTER — Other Ambulatory Visit: Payer: Self-pay | Admitting: Rheumatology

## 2018-01-13 NOTE — Telephone Encounter (Signed)
Last visit: 12/04/2017 Next visit: 04/06/2018 Labs: 10/16/17 Elevation in creatinine noted. Most likely related to use of lisinopril HCTZ .PLQ eye exam: 03/17/2017  Okay to refill per Dr. Estanislado Pandy

## 2018-01-25 ENCOUNTER — Ambulatory Visit
Admission: RE | Admit: 2018-01-25 | Discharge: 2018-01-25 | Disposition: A | Discharge status: Home / Self Care | Payer: Commercial Managed Care - PPO | Source: Ambulatory Visit | Attending: Family Medicine | Admitting: Family Medicine

## 2018-01-25 ENCOUNTER — Other Ambulatory Visit: Payer: Self-pay | Admitting: Family Medicine

## 2018-01-25 DIAGNOSIS — R05 Cough: Secondary | ICD-10-CM

## 2018-01-25 DIAGNOSIS — R059 Cough, unspecified: Secondary | ICD-10-CM

## 2018-03-02 ENCOUNTER — Other Ambulatory Visit: Payer: Self-pay | Admitting: Rheumatology

## 2018-03-02 DIAGNOSIS — Z79899 Other long term (current) drug therapy: Secondary | ICD-10-CM

## 2018-03-02 NOTE — Telephone Encounter (Addendum)
Last visit: 12/04/2017 Next visit: 04/06/2018 Labs: 10/16/2017 Elevation in creatinine noted. Most likely related to use of lisinopril HCTZ. TB Gold: 03/05/2017 negative   Advised patient he is due to update labs. Patient verbalized understanding and will be coming in on Friday to have labs drawn. Lab orders have been placed.   Okay to refill 30 day supply, per Dr. Estanislado Pandy.

## 2018-03-04 ENCOUNTER — Other Ambulatory Visit: Payer: Self-pay

## 2018-03-04 DIAGNOSIS — Z79899 Other long term (current) drug therapy: Secondary | ICD-10-CM

## 2018-03-05 NOTE — Progress Notes (Signed)
stable °

## 2018-03-06 LAB — COMPLETE METABOLIC PANEL WITH GFR
AG Ratio: 1.7 (calc) (ref 1.0–2.5)
ALT: 16 U/L (ref 9–46)
AST: 23 U/L (ref 10–35)
Albumin: 4 g/dL (ref 3.6–5.1)
Alkaline phosphatase (APISO): 51 U/L (ref 40–115)
BUN/Creatinine Ratio: 18 (calc) (ref 6–22)
BUN: 22 mg/dL (ref 7–25)
CO2: 31 mmol/L (ref 20–32)
CREATININE: 1.22 mg/dL — AB (ref 0.70–1.18)
Calcium: 9.2 mg/dL (ref 8.6–10.3)
Chloride: 101 mmol/L (ref 98–110)
GFR, Est African American: 69 mL/min/{1.73_m2} (ref >=60)
GFR, Est Non African American: 60 mL/min/{1.73_m2} (ref >=60)
GLUCOSE: 93 mg/dL (ref 65–99)
Globulin: 2.4 g/dL (calc) (ref 1.9–3.7)
Potassium: 4.3 mmol/L (ref 3.5–5.3)
Sodium: 138 mmol/L (ref 135–146)
Total Bilirubin: 0.4 mg/dL (ref 0.2–1.2)
Total Protein: 6.4 g/dL (ref 6.1–8.1)

## 2018-03-06 LAB — CBC WITH DIFFERENTIAL/PLATELET
BASOS PCT: 1.1 %
Basophils Absolute: 58 cells/uL (ref 0–200)
EOS PCT: 7.7 %
Eosinophils Absolute: 408 cells/uL (ref 15–500)
HCT: 41 % (ref 38.5–50.0)
Hemoglobin: 13.5 g/dL (ref 13.2–17.1)
Lymphs Abs: 1314 cells/uL (ref 850–3900)
MCH: 31 pg (ref 27.0–33.0)
MCHC: 32.9 g/dL (ref 32.0–36.0)
MCV: 94.3 fL (ref 80.0–100.0)
MPV: 10.6 fL (ref 7.5–12.5)
Monocytes Relative: 12.2 %
Neutro Abs: 2873 cells/uL (ref 1500–7800)
Neutrophils Relative %: 54.2 %
PLATELETS: 259 10*3/uL (ref 140–400)
RBC: 4.35 10*6/uL (ref 4.20–5.80)
RDW: 13.1 % (ref 11.0–15.0)
TOTAL LYMPHOCYTE: 24.8 %
WBC: 5.3 10*3/uL (ref 3.8–10.8)
WBCMIX: 647 {cells}/uL (ref 200–950)

## 2018-03-06 LAB — QUANTIFERON-TB GOLD PLUS
Mitogen-NIL: >10 IU/mL
NIL: 0.04 IU/mL
QuantiFERON-TB Gold Plus: NEGATIVE
TB1-NIL: <0 IU/mL
TB2-NIL: <0 IU/mL

## 2018-03-12 ENCOUNTER — Other Ambulatory Visit: Payer: Self-pay | Admitting: Rheumatology

## 2018-03-12 NOTE — Telephone Encounter (Signed)
ok 

## 2018-03-12 NOTE — Telephone Encounter (Signed)
Last visit: 12/04/2017 Next visit: 04/06/2018  Okay to refill Xanax?

## 2018-03-23 NOTE — Progress Notes (Signed)
Office Visit Note  Patient: [name]             Date of Birth: 1946-04-17           MRN: [id number]             PCP: Donald Prose, MD Referring: Donald Prose, MD Visit Date: 04/06/2018 Occupation: @GUAROCC @  Subjective:  Medication management.   History of Present Illness: [name] is a 72 y.o. male with history of rheumatoid arthritis and osteoarthritis overlap.  He states he is doing quite well on combination of Enbrel and Plaquenil.  He does not have any joint swelling.  He continues to have some joint stiffness.  He notices joint stiffness after prolonged sitting.  His left total knee replacement is doing well.  Currently he is not having much discomfort in his cervical and lumbar spine.  He continues to have chronic insomnia and has to take Xanax at bedtime.  He also has a history of anxiety.  He is moving to [facility]  Activities of Daily Living:  Patient reports morning stiffness for 5 minute.   Patient Denies nocturnal pain.  Difficulty dressing/grooming: Denies Difficulty climbing stairs: Denies Difficulty getting out of chair: Denies Difficulty using hands for taps, buttons, cutlery, and/or writing: Denies  Review of Systems  Constitutional: Negative for fatigue and night sweats.  HENT: Negative for mouth sores, mouth dryness and nose dryness.   Eyes: Negative for redness and dryness.  Respiratory: Negative for shortness of breath and difficulty breathing.   Cardiovascular: Negative for chest pain, palpitations, hypertension, irregular heartbeat and swelling in legs/feet.  Gastrointestinal: Negative for constipation and diarrhea.  Endocrine: Negative for increased urination.  Musculoskeletal: Negative for arthralgias, joint pain, joint swelling, myalgias, muscle weakness, morning stiffness, muscle tenderness and myalgias.  Skin: Negative for color change, rash, hair loss, nodules/bumps, skin tightness, ulcers and sensitivity to sunlight.    Allergic/Immunologic: Negative for susceptible to infections.  Neurological: Negative for dizziness, fainting, memory loss, night sweats and weakness ( ).  Hematological: Negative for swollen glands.  Psychiatric/Behavioral: Positive for sleep disturbance. Negative for depressed mood. The patient is not nervous/anxious.     PMFS History:  Patient Active Problem List   Diagnosis Date Noted  . Rheumatoid arthritis of multiple sites with negative rheumatoid factor ([place]) 09/08/2016  . Primary osteoarthritis of both knees 09/08/2016  . History of left knee replacement 09/08/2016  . Primary osteoarthritis of both hands 09/08/2016  . DJD (degenerative joint disease), cervical 09/08/2016  . Spondylosis of lumbar region without myelopathy or radiculopathy 09/08/2016  . Primary insomnia 09/08/2016  . History of anxiety 09/08/2016  . High risk medication use 08/19/2016  . Atrial flutter with rapid ventricular response ([place]) 06/06/2012  . Substernal chest pain 06/06/2012  . Hypotension 06/06/2012  . Hypertension 06/06/2012    Past Medical History:  Diagnosis Date  . BPH (benign prostatic hyperplasia)   . Dysrhythmia    atrial flutter  . History of pneumonia   . HTN (hypertension)   . Hypertension   . IBS (irritable bowel syndrome)   . PUD (peptic ulcer disease)    Dr. Paulita Fujita  . Rheumatoid arthritis(714.0)    Dr. Estanislado Pandy    Family History  Problem Relation Age of Onset  . CAD Father        MI x 3 (1st 56s)  . Heart failure Father   . Epilepsy Daughter    Past Surgical History:  Procedure Laterality Date  . ABLATION  OF DYSRHYTHMIC FOCUS  2013  . APPENDECTOMY    . ATRIAL FLUTTER ABLATION N/A 07/19/2012   Procedure: ATRIAL FLUTTER ABLATION;  Surgeon: Evans Lance, MD;  Location: [facility] CATH LAB;  Service: Cardiovascular;  Laterality: N/A;  . CARDIOVERSION N/A 06/07/2012   Procedure: CARDIOVERSION;  Surgeon: Evans Lance, MD;  Location: [facility] CATH LAB;  Service: Cardiovascular;   Laterality: N/A;  . KNEE ARTHROSCOPY  2011   left  . LEFT HEART CATHETERIZATION WITH CORONARY ANGIOGRAM N/A 06/07/2012   Procedure: LEFT HEART CATHETERIZATION WITH CORONARY ANGIOGRAM;  Surgeon: Peter M Martinique, MD;  Location: [facility] CATH LAB;  Service: Cardiovascular;  Laterality: N/A;  . NECK SURGERY  06/22/2017   melanoma removal   . TOTAL KNEE ARTHROPLASTY  2012   left total knee   Social History   Social History Narrative  . Not on file    Objective: Vital Signs: BP 140/86 (BP Location: Left Arm, Patient Position: Sitting, Cuff Size: Normal)   Pulse (!) 101   Resp 14   Ht 5\' 10"  (1.778 m)   Wt 192 lb 3.2 oz (87.2 kg)   BMI 27.58 kg/m    Physical Exam  Constitutional: He is oriented to person, place, and time. He appears well-developed and well-nourished.  HENT:  Head: Normocephalic and atraumatic.  Eyes: Pupils are equal, round, and reactive to light. Conjunctivae and EOM are normal.  Neck: Normal range of motion. Neck supple.  Cardiovascular: Normal rate, regular rhythm and normal heart sounds.  Pulmonary/Chest: Effort normal and breath sounds normal.  Abdominal: Soft. Bowel sounds are normal.  Neurological: He is alert and oriented to person, place, and time.  Skin: Skin is warm and dry. Capillary refill takes less than 2 seconds.  Psychiatric: He has a normal mood and affect. His behavior is normal.  Nursing note and vitals reviewed.    Musculoskeletal Exam: C-spine lumbar spine limited range of motion.  Shoulder joints elbow joints were in good range of motion.  He has some synovial thickening but no synovitis.  He has DIP and PIP thickening.  Hip joints were in good range of motion.  Left total knee replacement is doing well.  CDAI Exam: CDAI Score: 0.4  Patient Global Assessment: 2 (mm); Provider Global Assessment: 2 (mm) Swollen: 0 ; Tender: 0  Joint Exam   Not documented   There is currently no information documented on the homunculus. Go to the Rheumatology  activity and complete the homunculus joint exam.  Investigation: No additional findings.  Imaging: No results found.  Recent Labs: Lab Results  Component Value Date   WBC 5.3 03/04/2018   HGB 13.5 03/04/2018   PLT 259 03/04/2018   NA 138 03/04/2018   K 4.3 03/04/2018   CL 101 03/04/2018   CO2 31 03/04/2018   GLUCOSE 93 03/04/2018   BUN 22 03/04/2018   CREATININE 1.22 (H) 03/04/2018   BILITOT 0.4 03/04/2018   ALKPHOS 63 03/05/2017   AST 23 03/04/2018   ALT 16 03/04/2018   PROT 6.4 03/04/2018   ALBUMIN 4.2 03/05/2017   CALCIUM 9.2 03/04/2018   GFRAA 69 03/04/2018   QFTBGOLDPLUS NEGATIVE 03/04/2018    Speciality Comments: No specialty comments available.  Procedures:  No procedures performed Allergies: Sulfa antibiotics   Assessment / Plan:     Visit Diagnoses: Rheumatoid arthritis of multiple sites with negative rheumatoid factor (HCC)-patient had no synovitis on examination.  He continues to have some joint stiffness.  He has synovial thickening.  High risk  medication use - Enbrel 50 mg subcu weekly, PLQ 200 mg p.o. daily. eye exam: 03/17/2017.  He will get eye exam this year.  He may consider coming off Plaquenil as he is doing well.  As he will be moving to [facility] she will be getting some of his labs over there and some here.  He plans on coming to see [place] every 6 months.  Primary osteoarthritis of both hands-joint protection muscle strengthening was discussed.  Primary osteoarthritis of right knee  History of left knee replacement-doing well.  Spondylosis of lumbar region without myelopathy or radiculopathy-he has some lower back and stiffness but multiple discomfort.  DDD (degenerative disc disease), cervical-he has limited range of motion of the cervical spine.  Other chronic pain - discontinued tramadol.   Primary insomnia-better with the Xanax.  Side effects of Xanax were discussed.  History of anxiety -  Xanax 0.5 mg twice daily PRN.  He states is  unable to sleep at night without it.  And he also has to take it during daytime for anxiety.  He believes once he will retire he may need Xanax only at bedtime.  Orders: No orders of the defined types were placed in this encounter.  No orders of the defined types were placed in this encounter.   Face-to-face time spent with patient was 30 minutes. Greater than 50% of time was spent in counseling and coordination of care.  Follow-Up Instructions: Return in about 6 months (around 10/05/2018) for Rheumatoid arthritis.   Bo Merino, MD  Note - This record has been created using Editor, commissioning.  Chart creation errors have been sought, but may not always  have been located. Such creation errors do not reflect on  the standard of medical care.

## 2018-03-25 ENCOUNTER — Telehealth: Payer: Self-pay

## 2018-03-25 MED ORDER — ETANERCEPT 50 MG/ML ~~LOC~~ SOAJ: SUBCUTANEOUS | 0 refills | Status: AC

## 2018-03-25 NOTE — Telephone Encounter (Signed)
Refill request received via fax from [facility].   Last visit: 12/04/2017 Next visit: 04/06/2018 Labs: 03/04/2018 stable  Tb Gold: 03/04/2018 negative   Okay to refill per Dr. Estanislado Pandy.

## 2018-04-06 ENCOUNTER — Telehealth: Payer: Self-pay | Admitting: Pharmacy Technician

## 2018-04-06 ENCOUNTER — Ambulatory Visit: Payer: Commercial Managed Care - PPO | Admitting: Rheumatology

## 2018-04-06 ENCOUNTER — Other Ambulatory Visit: Payer: Self-pay | Admitting: Rheumatology

## 2018-04-06 ENCOUNTER — Encounter: Payer: Self-pay | Admitting: Rheumatology

## 2018-04-06 VITALS — BP 140/86 | HR 101 | Resp 14 | Ht 70.0 in | Wt 192.2 lb

## 2018-04-06 DIAGNOSIS — Z96652 Presence of left artificial knee joint: Secondary | ICD-10-CM

## 2018-04-06 DIAGNOSIS — Z79899 Other long term (current) drug therapy: Secondary | ICD-10-CM

## 2018-04-06 DIAGNOSIS — M5030 Other cervical disc degeneration, unspecified cervical region: Secondary | ICD-10-CM

## 2018-04-06 DIAGNOSIS — F5101 Primary insomnia: Secondary | ICD-10-CM

## 2018-04-06 DIAGNOSIS — M19041 Primary osteoarthritis, right hand: Secondary | ICD-10-CM | POA: Diagnosis not present

## 2018-04-06 DIAGNOSIS — M1711 Unilateral primary osteoarthritis, right knee: Secondary | ICD-10-CM | POA: Diagnosis not present

## 2018-04-06 DIAGNOSIS — G8929 Other chronic pain: Secondary | ICD-10-CM

## 2018-04-06 DIAGNOSIS — M19042 Primary osteoarthritis, left hand: Secondary | ICD-10-CM

## 2018-04-06 DIAGNOSIS — Z8659 Personal history of other mental and behavioral disorders: Secondary | ICD-10-CM

## 2018-04-06 DIAGNOSIS — M47816 Spondylosis without myelopathy or radiculopathy, lumbar region: Secondary | ICD-10-CM

## 2018-04-06 DIAGNOSIS — M0609 Rheumatoid arthritis without rheumatoid factor, multiple sites: Secondary | ICD-10-CM | POA: Diagnosis not present

## 2018-04-06 NOTE — Telephone Encounter (Signed)
Last visit: 12/04/2017 Next visit: 04/06/2018 Labs: 03/04/18 stable PLQ eye exam: 03/17/2017

## 2018-04-06 NOTE — Telephone Encounter (Signed)
Spoke with patient today regarding his Enbrel, he is moving to [place] and will also have an insurance change (to Medicare) around October 22nd. Patient will call office when he knows for sure the new plan is active and we will initiate the prior authorization process and patient assistance if needed.  9:15 AM Beatriz Chancellor, CPhT

## 2018-04-06 NOTE — Patient Instructions (Addendum)
Standing Labs We placed an order today for your standing lab work.    Please come back and get your standing labs in October and 3 months  We have open lab Monday through Friday from 8:30-11:30 AM and 1:30-4:00 PM  at the office of Dr. Bo Merino.   You may experience shorter wait times on Monday and Friday afternoons. The office is located at [address] [place], [place], [place] [address] No appointment is necessary.   Labs are drawn by [facility].  You may receive a bill from [facility] for your lab work. If you have any questions regarding directions or hours of operation,  please call [phone].

## 2018-05-11 ENCOUNTER — Telehealth: Payer: Self-pay | Admitting: Pharmacy Technician

## 2018-05-11 NOTE — Telephone Encounter (Signed)
Advised patient we did receive the plaquenil eye exam. Patient verbalized understanding.

## 2018-05-11 NOTE — Telephone Encounter (Signed)
Called patient to follow up on insurance change. Patient advised that new Medicare plan goes into effect November 1st. He will call back to give card details once he receives.  Briefly discussed patient assistance program for Enbrel.  9:27 AM Beatriz Chancellor, CPhT

## 2018-05-11 NOTE — Telephone Encounter (Signed)
Patient called in to see if we received his Plaquenil eye exam documentation. Dr. Corliss Marcus office should have faxed it over yesterday. If we have not received, patient gave office phone number.  MD office phone# [phone]

## 2018-05-21 NOTE — Telephone Encounter (Signed)
Ran eligibility check, new coverage not showing yet. Will try again on Monday.

## 2018-05-24 NOTE — Telephone Encounter (Signed)
Patient's [facility] insurance plan is now active. Enbrel copay for 1 month is over $1100. Discussed [employer] eligibility with patient. He states that he makes more than the programs max income guidelines for a household of 2, and will for the next few years.   Patient would like to discuss other treatment options. He currently has about 5 weeks of Enbrel on hand. And he is also taking 1 Plaquenil daily.  9:56 AM Beatriz Chancellor, CPhT

## 2018-05-25 NOTE — Telephone Encounter (Signed)
Findings of benefits investigation via test claims:   Insurance: [employer]  Enbrel, Rinvoq, Humira, and Darcus Pester are preferred. But they all have copays over $1,100. Darcus Pester being the cheapest. Spoke to insurance and they stated the all of the medications are expensive because they are tier 5 drugs.  Spoke to patient and his household income is way above all of the Manufacturers eligibility guidelines.  Advised we will look in to all options and call him back.  Patient states he currently has about 5-6 weeks of Enbrel on hand.  2:02 PM Beatriz Chancellor, CPhT

## 2018-05-26 NOTE — Telephone Encounter (Signed)
I called patient to discuss current situation with the drug coverage.  He states he just moved and has a lot of paperwork just go through.  He believes that he has a drug plan which should cover his medication.  He will call us back with the details.  He has tried methotrexate in the past and did not have a good response.

## 2018-06-28 ENCOUNTER — Other Ambulatory Visit: Payer: Self-pay | Admitting: Rheumatology

## 2018-06-28 NOTE — Telephone Encounter (Signed)
Patient is planning on continuing care here. Patient will schedule an appointment in January 2020.

## 2018-06-28 NOTE — Telephone Encounter (Signed)
Patient has moved to [place] [employer].  Please check with patient if he has established with a rheumatologist there or he will come back here for follow-up visit.  If he will continue to come back here then we can refill his medication.  We also discussed reducing his alprazolam to 0.5 mg at bedtime during the last visit as he is retired now and does not need to take higher dose.

## 2018-06-28 NOTE — Telephone Encounter (Signed)
Attempted to contact patient and left message for patient to call the office.  

## 2018-06-28 NOTE — Telephone Encounter (Signed)
Last Visit: 04/06/18 Next Visit: due March 2020. Message sent to the front to schedule patient  Okay to refill Xanax?

## 2018-06-28 NOTE — Telephone Encounter (Signed)
Please schedule patient for a follow up visit. Patient due in March 2020 Thanks!

## 2018-06-29 NOTE — Telephone Encounter (Signed)
[facility] for patient to call and schedule follow-up appointment in January 2020.

## 2018-07-29 ENCOUNTER — Other Ambulatory Visit: Payer: Self-pay | Admitting: Rheumatology

## 2018-07-29 NOTE — Telephone Encounter (Signed)
Last Visit: 04/06/18 Next Visit: due March 2020. Message sent to the front to schedule patient  Okay to refill Xanax?

## 2018-07-29 NOTE — Telephone Encounter (Signed)
ok 

## 2018-09-15 ENCOUNTER — Other Ambulatory Visit: Payer: Self-pay | Admitting: Rheumatology

## 2018-09-16 NOTE — Telephone Encounter (Signed)
ok 

## 2018-09-16 NOTE — Telephone Encounter (Signed)
Last Visit: 04/06/18 Next Visit: due March 2020. Message sent to the front to schedule patient  Okay to refill Xanax?

## 2018-10-12 ENCOUNTER — Other Ambulatory Visit: Payer: Self-pay | Admitting: Rheumatology

## 2018-10-12 NOTE — Telephone Encounter (Signed)
Last Visit: 04/06/18 Next Visit: due March 2020.   Okay to refill Xanax?

## 2018-10-12 NOTE — Telephone Encounter (Signed)
ok 

## 2018-12-16 ENCOUNTER — Other Ambulatory Visit: Payer: Self-pay | Admitting: Rheumatology

## 2018-12-16 NOTE — Telephone Encounter (Signed)
Last Visit: 04/06/18 Next Visit: 01/05/19  Okay to refill Xanax?  Patient also states that he has been off his MTX for 3 months because his insurance will not cover it. He has increased his PLQ to 400 mg daily. Patient states he would liek for you to consider Actemra infusions. Patient would like to discuss athis upcoming appointment.

## 2018-12-16 NOTE — Telephone Encounter (Signed)
ok 

## 2018-12-23 NOTE — Progress Notes (Signed)
Office Visit Note  Patient: [name]             Date of Birth: 1945/09/15           MRN: [id number]             PCP: Donald Prose, MD Referring: Donald Prose, MD Visit Date: 01/05/2019 Occupation: @GUAROCC @  Subjective:  Left jaw pain.  History of Present Illness: [name] is a 73 y.o. male with history of seronegative rheumatoid arthritis.  He states he has been off Enbrel for the last 4 months due to insurance coverage.  He is on Medicare now and  Enbrel is not covered.  He could not meet patient assistance program as well.  He increase his dose of Plaquenil to twice daily but it is not helping him.  He has taken methotrexate in the past but could not tolerate the side effects.  He also has elevated creatinine.  Recently has been experiencing pain and discomfort in multiple joints including his left TMJ area.  He states the left TMJ pain is severe at night.  Been taking tramadol for pain management. He has moved to [facility] and  follows up with [place] every 6 months.  But we have not received any lab results since August 2019.  Last fill of Enbrel and Plaquenil September 2019.  Activities of Daily Living:  Patient reports morning stiffness for 30 minutes.   Patient Reports nocturnal pain.  Difficulty dressing/grooming: Denies Difficulty climbing stairs: Denies Difficulty getting out of chair: Denies Difficulty using hands for taps, buttons, cutlery, and/or writing: Reports  Review of Systems  Constitutional: Negative for fatigue and night sweats.  HENT: Negative for mouth sores, mouth dryness and nose dryness.   Eyes: Negative for redness, itching and dryness.  Respiratory: Negative for shortness of breath, wheezing and difficulty breathing.   Cardiovascular: Negative for chest pain, palpitations, hypertension, irregular heartbeat and swelling in legs/feet.  Gastrointestinal: Negative for abdominal pain, constipation and diarrhea.  Endocrine: Negative for  increased urination.  Genitourinary: Negative for painful urination and pelvic pain.  Musculoskeletal: Positive for arthralgias, joint pain, joint swelling and morning stiffness. Negative for myalgias, muscle weakness, muscle tenderness and myalgias.  Skin: Negative for color change, rash, hair loss, nodules/bumps, redness, skin tightness, ulcers and sensitivity to sunlight.  Allergic/Immunologic: Negative for susceptible to infections.  Neurological: Negative for dizziness, fainting, headaches, memory loss, night sweats and weakness.  Hematological: Negative for swollen glands.  Psychiatric/Behavioral: Negative for depressed mood, confusion and sleep disturbance. The patient is not nervous/anxious.        Nocturnal pain    PMFS History:  Patient Active Problem List   Diagnosis Date Noted  . Rheumatoid arthritis of multiple sites with negative rheumatoid factor ([place]) 09/08/2016  . Primary osteoarthritis of both knees 09/08/2016  . History of left knee replacement 09/08/2016  . Primary osteoarthritis of both hands 09/08/2016  . DJD (degenerative joint disease), cervical 09/08/2016  . Spondylosis of lumbar region without myelopathy or radiculopathy 09/08/2016  . Primary insomnia 09/08/2016  . History of anxiety 09/08/2016  . High risk medication use 08/19/2016  . Atrial flutter with rapid ventricular response ([place]) 06/06/2012  . Substernal chest pain 06/06/2012  . Hypotension 06/06/2012  . Hypertension 06/06/2012    Past Medical History:  Diagnosis Date  . BPH (benign prostatic hyperplasia)   . Dysrhythmia    atrial flutter  . History of pneumonia   . HTN (hypertension)   . Hypertension   .  IBS (irritable bowel syndrome)   . PUD (peptic ulcer disease)    Dr. Paulita Fujita  . Rheumatoid arthritis(714.0)    Dr. Estanislado Pandy    Family History  Problem Relation Age of Onset  . CAD Father        MI x 3 (1st 67s)  . Heart failure Father   . Epilepsy Daughter    Past Surgical History:   Procedure Laterality Date  . ABLATION OF DYSRHYTHMIC FOCUS  2013  . APPENDECTOMY    . ATRIAL FLUTTER ABLATION N/A 07/19/2012   Procedure: ATRIAL FLUTTER ABLATION;  Surgeon: Evans Lance, MD;  Location: [facility] CATH LAB;  Service: Cardiovascular;  Laterality: N/A;  . CARDIOVERSION N/A 06/07/2012   Procedure: CARDIOVERSION;  Surgeon: Evans Lance, MD;  Location: [facility] CATH LAB;  Service: Cardiovascular;  Laterality: N/A;  . KNEE ARTHROSCOPY  2011   left  . LEFT HEART CATHETERIZATION WITH CORONARY ANGIOGRAM N/A 06/07/2012   Procedure: LEFT HEART CATHETERIZATION WITH CORONARY ANGIOGRAM;  Surgeon: Peter M Martinique, MD;  Location: [facility] CATH LAB;  Service: Cardiovascular;  Laterality: N/A;  . NECK SURGERY  06/22/2017   melanoma removal   . TOTAL KNEE ARTHROPLASTY  2012   left total knee   Social History   Social History Narrative  . Not on file   Immunization History  Administered Date(s) Administered  . Influenza Inj Mdck Quad Pf 05/07/2018  . Influenza, High Dose Seasonal PF 04/23/2014, 04/26/2015, 04/24/2016  . Influenza-Unspecified 04/29/2012     Objective: Vital Signs: BP (!) 153/91 (BP Location: Left Arm, Patient Position: Sitting, Cuff Size: Normal)   Pulse 72   Resp 14   Ht 5\' 10"  (1.778 m)   Wt 194 lb (88 kg)   BMI 27.84 kg/m    Physical Exam Vitals signs and nursing note reviewed.  Constitutional:      Appearance: He is well-developed.  HENT:     Head: Normocephalic and atraumatic.  Eyes:     Conjunctiva/sclera: Conjunctivae normal.     Pupils: Pupils are equal, round, and reactive to light.  Neck:     Musculoskeletal: Normal range of motion and neck supple.  Cardiovascular:     Rate and Rhythm: Normal rate and regular rhythm.     Heart sounds: Normal heart sounds.  Pulmonary:     Effort: Pulmonary effort is normal.     Breath sounds: Normal breath sounds.  Abdominal:     General: Bowel sounds are normal.     Palpations: Abdomen is soft.  Skin:    General: Skin  is warm and dry.     Capillary Refill: Capillary refill takes less than 2 seconds.  Neurological:     Mental Status: He is alert and oriented to person, place, and time.  Psychiatric:        Behavior: Behavior normal.      Musculoskeletal Exam: C-spine thoracic and lumbar spine good range of motion.  He had tenderness on palpation over left TMJ.  Shoulder joints elbow joints wrist joints with good range of motion.  He had mild tenderness over wrist joints with no synovitis.  DIP PIP and MCP thickening was noted without synovitis.  Hip joints knee joints ankles MTPs PIPs with good range of motion.  He has some tenderness over ankle joints.  No synovitis was noted.  CDAI Exam: CDAI Score: 3.2  Patient Global: 7 mm; Provider Global: 5 mm Swollen: 0 ; Tender: 5  Joint Exam      Right  Left  TMJ      Tender  Wrist   Tender   Tender  Ankle   Tender   Tender     Investigation: No additional findings.  Imaging: No results found.  Recent Labs: Lab Results  Component Value Date   WBC 5.3 03/04/2018   HGB 13.5 03/04/2018   PLT 259 03/04/2018   NA 138 03/04/2018   K 4.3 03/04/2018   CL 101 03/04/2018   CO2 31 03/04/2018   GLUCOSE 93 03/04/2018   BUN 22 03/04/2018   CREATININE 1.22 (H) 03/04/2018   BILITOT 0.4 03/04/2018   ALKPHOS 63 03/05/2017   AST 23 03/04/2018   ALT 16 03/04/2018   PROT 6.4 03/04/2018   ALBUMIN 4.2 03/05/2017   CALCIUM 9.2 03/04/2018   GFRAA 69 03/04/2018   QFTBGOLDPLUS NEGATIVE 03/04/2018    Speciality Comments: PLQ eye exam: 05/10/2018 normal @ [employer]. Follow up in 12 months.  Procedures:  No procedures performed Allergies: Sulfa antibiotics   Assessment / Plan:     Visit Diagnoses: Rheumatoid arthritis of multiple sites with negative rheumatoid factor ([place]) -patient has been off Enbrel for last 4 months now.  He has been taking Plaquenil 200 mg twice daily.  He has been experiencing increased arthralgias and discomfort.  His left TMJ has  been extremely painful.  I will give him a short course of prednisone starting at 20 mg and taper by 5 mg every 4 days.  We also discussed different treatment options.  With him being on Medicare a lot of medications are not covered.  He does not meet the patient assistance program criteria.  We discussed possible addition of [place].  He has taken methotrexate in the past and could not tolerate it.  After discussing indications side effects contraindications may start him on Arava 10 mg p.o. daily.  If tolerated we will increase it to 20 mg p.o. daily.  He will get baseline labs today.  If his labs are stable then they can be checked every 3 months.  High risk medication use -he is on Plaquenil 1 tablet p.o. twice daily.  (Enbrel was discontinued due to insurance change.)  Last Plaquenil eye exam normal on 05/10/2018.  Last TB gold negative on 03/04/2018 and will monitor yearly.  Most recent CBC/CMP stable on 03/04/2018.  He is overdue for labs.  CBC/CMP ordered for today and will monitor every 3 months.  Standing orders are in place.  He received a high-dose flu vaccine in October.  Recommend Prevnar 13, Pneumovax 23, and Shingrix vaccine as indicated. - Plan: CBC with Differential/Platelet, COMPLETE METABOLIC PANEL WITH GFR, today and then in 1 month x 2 and then every 3 months.  Primary osteoarthritis of both hands -joint protection muscle strengthening was discussed.  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  Prednisone taper should help.  Primary osteoarthritis of right knee -he continues to have some discomfort but no swelling was noted.  History of left knee replacement - Plan: Doing well.  DDD (degenerative disc disease), cervical -he has some stiffness.  Spondylosis of lumbar region without myelopathy or radiculopathy -he has chronic discomfort but currently not having any radiculopathy.  Primary insomnia -secondary to nocturnal pain.  He states he has been using Xanax  more often due to insomnia.  Other chronic pain -he had some leftover prescription of tramadol which he restarted due to pain.  We will renew narcotic agreement and UDS today.  Prescription refill for tramadol 1  tablet p.o. twice daily PRN total 60 tablets were given.  History of anxiety - Xanax at bedtime   Therapeutic drug monitoring - Plan: Pain Mgmt, Profile 5 w/Conf, U, Pain Mgmt, Tramadol w/medMATCH, U, Pain Mgmt, Tramadol w/medMATCH, U, Pain Mgmt, Profile 5 w/Conf, U  Medication monitoring encounter - Plan: Pain Mgmt, Profile 5 w/Conf, U, Pain Mgmt, Tramadol w/medMATCH, U, Pain Mgmt, Tramadol w/medMATCH, U, Pain Mgmt, Profile 5 w/Conf, U,   Orders: Orders Placed This Encounter  Procedures  . CBC with Differential/Platelet  . COMPLETE METABOLIC PANEL WITH GFR  . Pain Mgmt, Profile 5 w/Conf, U  . Pain Mgmt, Tramadol w/medMATCH, U   Meds ordered this encounter  Medications  . traMADol (ULTRAM) 50 MG tablet    Sig: TAKE 1 TABLET BY MOUTH TWICE DAILY AS NEEDED FOR PAIN    Dispense:  60 tablet    Refill:  0    Face-to-face time spent with patient was 30 minutes. Greater than 50% of time was spent in counseling and coordination of care.  Follow-Up Instructions: 3 months   Bo Merino, MD  Note - This record has been created using Editor, commissioning.  Chart creation errors have been sought, but may not always  have been located. Such creation errors do not reflect on  the standard of medical care.

## 2019-01-05 ENCOUNTER — Other Ambulatory Visit: Payer: Self-pay

## 2019-01-05 ENCOUNTER — Encounter: Payer: Self-pay | Admitting: Rheumatology

## 2019-01-05 ENCOUNTER — Ambulatory Visit (INDEPENDENT_AMBULATORY_CARE_PROVIDER_SITE_OTHER): Payer: Medicare Other | Admitting: Rheumatology

## 2019-01-05 ENCOUNTER — Telehealth: Payer: Self-pay | Admitting: Pharmacist

## 2019-01-05 VITALS — BP 153/91 | HR 72 | Resp 14 | Ht 70.0 in | Wt 194.0 lb

## 2019-01-05 DIAGNOSIS — Z79899 Other long term (current) drug therapy: Secondary | ICD-10-CM | POA: Diagnosis not present

## 2019-01-05 DIAGNOSIS — M26622 Arthralgia of left temporomandibular joint: Secondary | ICD-10-CM

## 2019-01-05 DIAGNOSIS — Z9225 Personal history of immunosupression therapy: Secondary | ICD-10-CM

## 2019-01-05 DIAGNOSIS — M19041 Primary osteoarthritis, right hand: Secondary | ICD-10-CM

## 2019-01-05 DIAGNOSIS — M1711 Unilateral primary osteoarthritis, right knee: Secondary | ICD-10-CM | POA: Diagnosis not present

## 2019-01-05 DIAGNOSIS — Z8659 Personal history of other mental and behavioral disorders: Secondary | ICD-10-CM

## 2019-01-05 DIAGNOSIS — F5101 Primary insomnia: Secondary | ICD-10-CM

## 2019-01-05 DIAGNOSIS — M19042 Primary osteoarthritis, left hand: Secondary | ICD-10-CM

## 2019-01-05 DIAGNOSIS — Z5181 Encounter for therapeutic drug level monitoring: Secondary | ICD-10-CM

## 2019-01-05 DIAGNOSIS — M0609 Rheumatoid arthritis without rheumatoid factor, multiple sites: Secondary | ICD-10-CM | POA: Diagnosis not present

## 2019-01-05 DIAGNOSIS — M5030 Other cervical disc degeneration, unspecified cervical region: Secondary | ICD-10-CM

## 2019-01-05 DIAGNOSIS — Z96652 Presence of left artificial knee joint: Secondary | ICD-10-CM

## 2019-01-05 DIAGNOSIS — G8929 Other chronic pain: Secondary | ICD-10-CM

## 2019-01-05 DIAGNOSIS — M47816 Spondylosis without myelopathy or radiculopathy, lumbar region: Secondary | ICD-10-CM

## 2019-01-05 MED ORDER — LEFLUNOMIDE 10 MG PO TABS: ORAL_TABLET | ORAL | 0 refills | Status: DC

## 2019-01-05 MED ORDER — PREDNISONE 5 MG PO TABS: ORAL_TABLET | ORAL | 0 refills | Status: AC

## 2019-01-05 MED ORDER — ALPRAZOLAM 0.5 MG PO TABS: 0.5000 mg | ORAL_TABLET | Freq: Every day | ORAL | 0 refills | Status: DC

## 2019-01-05 MED ORDER — TRAMADOL HCL 50 MG PO TABS: ORAL_TABLET | ORAL | 0 refills | Status: AC

## 2019-01-05 NOTE — Patient Instructions (Addendum)
Standing Labs We placed an order today for your standing lab work.    Please come back and get your standing labs in 2 weeks, then 4 weeks, then 8 weeks, then every 3 months.    We have open lab daily Monday through Thursday from 8:30-12:30 PM and 1:30-4:30 PM and Friday from 8:30-12:30 PM and 1:30 -4:00 PM at the office of Dr. Bo Merino.   You may experience shorter wait times on Monday and Friday afternoons. The office is located at [address], [place], [place], [place] [address] No appointment is necessary.   Labs are drawn by [facility].  You may receive a bill from [facility] [facility] for your lab work.  If you wish to have your labs drawn at another location, please call the office 24 hours in advance to send orders.  If you have any questions regarding directions or hours of operation,  please call [phone].  Fax results to [phone]. Just as a reminder please drink plenty of water prior to coming for your lab work. Thanks!  Vaccines You are taking a medication(s) that can suppress your immune system.  The following immunizations are recommended: . Flu annually . Pneumonia (Pneumovax 23 and Prevnar 13 spaced at least 1 year apart) . Shingrix  Please check with your PCP to make sure you are up to date.   Leflunomide tablets What is this medicine? LEFLUNOMIDE (le FLOO na mide) is for rheumatoid arthritis. This medicine may be used for other purposes; ask your health care provider or pharmacist if you have questions. COMMON BRAND NAME(S): Arava What should I tell my health care provider before I take this medicine? They need to know if you have any of these conditions: -diabetes -have a fever or infection -high blood pressure -immune system problems -kidney disease -liver disease -low blood cell counts, like low white cell, platelet, or red cell counts -lung or breathing disease, like asthma -recently received or scheduled to receive a vaccine -receiving treatment  for cancer -skin conditions or sensitivity -tingling of the fingers or toes, or other nerve disorder -tuberculosis -an unusual or allergic reaction to leflunomide, teriflunomide, other medicines, food, dyes, or preservatives -pregnant or trying to get pregnant -breast-feeding How should I use this medicine? Take this medicine by mouth with a full glass of water. Follow the directions on the prescription label. Take your medicine at regular intervals. Do not take your medicine more often than directed. Do not stop taking except on your doctor's advice. Talk to your pediatrician regarding the use of this medicine in children. Special care may be needed. Overdosage: If you think you have taken too much of this medicine contact a poison control center or emergency room at once. NOTE: This medicine is only for you. Do not share this medicine with others. What if I miss a dose? If you miss a dose, take it as soon as you can. If it is almost time for your next dose, take only that dose. Do not take double or extra doses. What may interact with this medicine? Do not take this medicine with any of the following medications: -teriflunomide This medicine may also interact with the following medications: -alosetron -birth control pills -caffeine -cefaclor -certain medicines for diabetes like nateglinide, repaglinide, rosiglitazone, pioglitazone -certain medicines for high cholesterol like atorvastatin, pravastatin, rosuvastatin, simvastatin -charcoal -cholestyramine -ciprofloxacin -duloxetine -furosemide -ketoprofen -live virus vaccines -medicines that increase your risk for infection -methotrexate -mitoxantrone -paclitaxel -penicillin -theophylline -tizanidine -warfarin This list may not describe all possible interactions.  Give your health care provider a list of all the medicines, herbs, non-prescription drugs, or dietary supplements you use. Also tell them if you smoke, drink alcohol,  or use illegal drugs. Some items may interact with your medicine. What should I watch for while using this medicine? Visit your healthcare professional for regular checks on your progress. Tell your doctor or healthcare professional if your symptoms do not start to get better or if they get worse. You may need blood work done while you are taking this medicine. This medicine may stay in your body for up to 2 years after your last dose. Tell your doctor about any unusual side effects or symptoms. A medicine can be given to help lower your blood levels of this medicine more quickly. Women must use effective birth control with this medicine. There is a potential for serious side effects to an unborn child. Do not become pregnant while taking this medicine. Inform your doctor if you wish to become pregnant. This medicine remains in your blood after you stop taking it. You must continue using effective birth control until the blood levels have been checked and they are low enough. A medicine can be given to help lower your blood levels of this medicine more quickly. Immediately talk to your doctor if you think you may be pregnant. You may need a pregnancy test. Talk to your health care professional or pharmacist for more information. You should not receive certain vaccines during your treatment and for a certain time after your treatment with this medication ends. Talk to your health care professional for more information. What side effects may I notice from receiving this medicine? Side effects that you should report to your doctor or health care professional as soon as possible: -allergic reactions like skin rash, itching or hives, swelling of the face, lips, or tongue -breathing problems -cough -increased blood pressure -low blood counts - this medicine may decrease the number of white blood cells and platelets. You may be at increased risk for infections and bleeding. -pain, tingling, numbness in the  hands or feet -redness, blistering, peeing or loosening of the skin, including inside the mouth -signs of decreased platelets or bleeding - bruising, pinpoint red spots on the skin, black, tarry stools, blood in urine -signs of infection - fever or chills, cough, sore throat, pain or trouble passing urine -signs and symptoms of liver injury like dark yellow or brown urine; general ill feeling or flu-like symptoms; light-colored stools; loss of appetite; nausea; right upper belly pain; unusually weak or tired; yellowing of the eyes or skin -trouble passing urine or change in the amount of urine -vomiting Side effects that usually do not require medical attention (report to your doctor or health care professional if they continue or are bothersome): -diarrhea -hair thinning or loss -headache -nausea -tiredness This list may not describe all possible side effects. Call your doctor for medical advice about side effects. You may report side effects to FDA at 1-800-FDA-1088. Where should I keep my medicine? Keep out of the reach of children. Store at room temperature between 15 and 30 degrees C (59 and 86 degrees F). Protect from moisture and light. Throw away any unused medicine after the expiration date. NOTE: This sheet is a summary. It may not cover all possible information. If you have questions about this medicine, talk to your doctor, pharmacist, or health care provider.  2019 Elsevier/Gold Standard (2016-09-02 13:49:00)

## 2019-01-05 NOTE — Telephone Encounter (Signed)
Patient dose will be Arava 10 mg daily for 2 weeks then increase to 20 mg daily if tolerated.  Prescription pending lab results.

## 2019-01-05 NOTE — Progress Notes (Signed)
Pharmacy Note  Subjective: Patient presents today to the [facility] to see Dr. Estanislado Pandy.  Patient seen by the pharmacist for counseling on leflunomide Jolee Ewing) for rheumatoid arthritis.  He was on Enbrel but is no longer able to afford the medication.  He is currently on Plaquenil 200 mg twice daily.  Objective: CBC    Component Value Date/Time   WBC 5.3 03/04/2018 1312   RBC 4.35 03/04/2018 1312   HGB 13.5 03/04/2018 1312   HCT 41.0 03/04/2018 1312   PLT 259 03/04/2018 1312   MCV 94.3 03/04/2018 1312   MCH 31.0 03/04/2018 1312   MCHC 32.9 03/04/2018 1312   RDW 13.1 03/04/2018 1312   LYMPHSABS 1,314 03/04/2018 1312   MONOABS 1,122 (H) 03/05/2017 0842   EOSABS 408 03/04/2018 1312   BASOSABS 58 03/04/2018 1312    CMP     Component Value Date/Time   NA 138 03/04/2018 1312   K 4.3 03/04/2018 1312   CL 101 03/04/2018 1312   CO2 31 03/04/2018 1312   GLUCOSE 93 03/04/2018 1312   BUN 22 03/04/2018 1312   CREATININE 1.22 (H) 03/04/2018 1312   CALCIUM 9.2 03/04/2018 1312   PROT 6.4 03/04/2018 1312   ALBUMIN 4.2 03/05/2017 0842   AST 23 03/04/2018 1312   ALT 16 03/04/2018 1312   ALKPHOS 63 03/05/2017 0842   BILITOT 0.4 03/04/2018 1312   GFRNONAA 60 03/04/2018 1312   GFRAA 69 03/04/2018 1312    Baseline Immunosuppressant Therapy Labs Quantiferon TB Gold Latest Ref Rng & Units 03/04/2018  Quantiferon TB Gold Plus NEGATIVE NEGATIVE   Hepatitis Panel: negative 07/05/2008  HIV: pending next visit  SPEP: pending next visit  Immunoglobulins: pending next visit  No results found for: G6PDH  No results found for: TPMT   Chest x-ray: no active cardiopulmonary disease   Assessment/Plan:  Patient was counseled on the purpose, proper use, and adverse effects of leflunomide including risk of infection, nausea/diarrhea/weight loss, increase in blood pressure, rash, hair loss, tingling in the hands and feet, and signs and symptoms of interstitial lung disease.    Also counseled on [place] warning of liver injury and importance of avoiding alcohol while on therapy. Discussed that there is the possibility of an increased risk of malignancy but it is not well understood if this increased risk is due to the medication or the disease state.  Counseled patient to avoid live vaccines. Recommend annual influenza, Pneumovax 23, Prevnar 13, and Shingrix as indicated.   Discussed the importance of frequent monitoring of liver function and blood count.  Standing orders placed.  Discussed importance of birth control while on leflunomide due to risk of congenital abnormalities.  Provided patient with educational materials on leflunomide and answered all questions.  Patient consented to [place] use, and consent will be uploaded into the media tab.    Patient dose will be 10 mg daily for 2 weeks then increase to 20 mg daily if tolerated.  Prescription pending lab results.  All questions encouraged and answered.  Instructed patient to call with any further questions or concerns.  Mariella Saa, PharmD, [facility] Rheumatology Clinical Pharmacist  01/05/2019 2:27 PM

## 2019-01-07 ENCOUNTER — Telehealth: Payer: Self-pay | Admitting: Rheumatology

## 2019-01-07 NOTE — Telephone Encounter (Signed)
Patient left a voicemail checking on his prescription refills.  Seth Bake states she spoke with patient's pharmacy this morning and will contact patient to update.

## 2019-01-08 LAB — COMPLETE METABOLIC PANEL WITH GFR
AG Ratio: 1.7 (calc) (ref 1.0–2.5)
ALT: 21 U/L (ref 9–46)
AST: 25 U/L (ref 10–35)
Albumin: 4.3 g/dL (ref 3.6–5.1)
Alkaline phosphatase (APISO): 57 U/L (ref 35–144)
BUN/Creatinine Ratio: 12 (calc) (ref 6–22)
BUN: 15 mg/dL (ref 7–25)
CO2: 29 mmol/L (ref 20–32)
Calcium: 9.7 mg/dL (ref 8.6–10.3)
Chloride: 102 mmol/L (ref 98–110)
Creat: 1.25 mg/dL — ABNORMAL HIGH (ref 0.70–1.18)
GFR, Est African American: 67 mL/min/{1.73_m2} (ref >=60)
GFR, Est Non African American: 58 mL/min/{1.73_m2} — ABNORMAL LOW (ref >=60)
Globulin: 2.6 g/dL (calc) (ref 1.9–3.7)
Glucose, Bld: 89 mg/dL (ref 65–99)
Potassium: 4.6 mmol/L (ref 3.5–5.3)
Sodium: 140 mmol/L (ref 135–146)
Total Bilirubin: 0.4 mg/dL (ref 0.2–1.2)
Total Protein: 6.9 g/dL (ref 6.1–8.1)

## 2019-01-08 LAB — CBC WITH DIFFERENTIAL/PLATELET
Absolute Monocytes: 680 cells/uL (ref 200–950)
Basophils Absolute: 72 cells/uL (ref 0–200)
Basophils Relative: 1.6 %
Eosinophils Absolute: 392 cells/uL (ref 15–500)
Eosinophils Relative: 8.7 %
HCT: 41.6 % (ref 38.5–50.0)
Hemoglobin: 14 g/dL (ref 13.2–17.1)
Lymphs Abs: 1071 cells/uL (ref 850–3900)
MCH: 30.6 pg (ref 27.0–33.0)
MCHC: 33.7 g/dL (ref 32.0–36.0)
MCV: 90.8 fL (ref 80.0–100.0)
MPV: 10.4 fL (ref 7.5–12.5)
Monocytes Relative: 15.1 %
Neutro Abs: 2286 cells/uL (ref 1500–7800)
Neutrophils Relative %: 50.8 %
Platelets: 273 10*3/uL (ref 140–400)
RBC: 4.58 10*6/uL (ref 4.20–5.80)
RDW: 13.6 % (ref 11.0–15.0)
Total Lymphocyte: 23.8 %
WBC: 4.5 10*3/uL (ref 3.8–10.8)

## 2019-01-08 LAB — PAIN MGMT, PROFILE 5 W/CONF, U
Alphahydroxyalprazolam: NEGATIVE ng/mL
Alphahydroxymidazolam: NEGATIVE ng/mL
Alphahydroxytriazolam: NEGATIVE ng/mL
Aminoclonazepam: NEGATIVE ng/mL
Amphetamines: NEGATIVE ng/mL
Barbiturates: NEGATIVE ng/mL
Benzodiazepines: NEGATIVE ng/mL
Cocaine Metabolite: NEGATIVE ng/mL
Creatinine: 51.4 mg/dL
Hydroxyethylflurazepam: NEGATIVE ng/mL
Lorazepam: NEGATIVE ng/mL
Marijuana Metabolite: NEGATIVE ng/mL
Methadone Metabolite: NEGATIVE ng/mL
Nordiazepam: NEGATIVE ng/mL
Opiates: NEGATIVE ng/mL
Oxazepam: NEGATIVE ng/mL
Oxidant: NEGATIVE ug/mL
Oxycodone: NEGATIVE ng/mL
Temazepam: NEGATIVE ng/mL
pH: 7.2 (ref 4.5–9.0)

## 2019-01-08 LAB — PAIN MGMT, TRAMADOL W/MEDMATCH, U
Desmethyltramadol: 677 ng/mL
Tramadol: 738 ng/mL

## 2019-01-11 NOTE — Telephone Encounter (Signed)
[place] and advised PA has been completed and approved. Pharmacist was able to run the prescription while on the phone and the prescription went through. Patient advised the prescription is ready at the pharmacy. Patient asked about his labs. Patient advised CBC is normal and CMP is stable. Patient verbalized understanding.

## 2019-01-11 NOTE — Telephone Encounter (Signed)
Received a fax from [facility] regarding a prior authorization for [place].  Approved from 07/21/2018 till 07/21/2019.  Will send document to scan center.  Authorization #  Phone #  [phone].

## 2019-01-28 ENCOUNTER — Telehealth: Payer: Self-pay | Admitting: Rheumatology

## 2019-01-28 NOTE — Telephone Encounter (Signed)
Patient called to let Dr. Estanislado Pandy know that he will be having labwork on Monday 01/31/19 and will have the results faxed to the office.

## 2019-01-28 NOTE — Telephone Encounter (Signed)
Noted  

## 2019-02-09 ENCOUNTER — Telehealth: Payer: Self-pay | Admitting: Rheumatology

## 2019-02-09 MED ORDER — METHYLPREDNISOLONE 4 MG PO TBPK: ORAL_TABLET | ORAL | 0 refills | Status: AC

## 2019-02-09 NOTE — Telephone Encounter (Signed)
Spoke with patient he is having a sever reaction to the [place]. Patient states he got out into the sun to play golf. Patient states he had a rash and bleeding rash from his back. Patient states he went to see a new dermatologist. Patient states the dermatologist gave him triamcinolone cream. Patient states he has been using the cream and it has helped but is still having trouble in the heat. Patient has cut down the [place] . Patient has been advised to discontinue to medication. Patient states the rash is no longer bleeding since using the cream. Patient states he is not diabetic. Patient states he had discussed another medication with Dr. Estanislado Pandy and it was an infusion. Per Dr. Estanislado Pandy benadryl and incase Benadryl dose not work send in Medrol dose pack to clear rash, .

## 2019-02-09 NOTE — Telephone Encounter (Signed)
Patient calling in reference to generic Plaquenil. Patient is having severe reaction to it. Patient has developed a bleeding rash, and severe heat sensitivity. Patient's dermatologist confirmed medication reaction. Patient has stopped medication. Please call to advise.

## 2019-02-15 ENCOUNTER — Other Ambulatory Visit: Payer: Self-pay | Admitting: Rheumatology

## 2019-02-15 DIAGNOSIS — Z8659 Personal history of other mental and behavioral disorders: Secondary | ICD-10-CM

## 2019-02-15 DIAGNOSIS — F5101 Primary insomnia: Secondary | ICD-10-CM

## 2019-02-15 NOTE — Telephone Encounter (Signed)
Last Visit: 01/05/19 Next visit: 04/06/19  Okay to refill Xanax?

## 2019-03-18 ENCOUNTER — Other Ambulatory Visit: Payer: Self-pay | Admitting: Rheumatology

## 2019-03-18 DIAGNOSIS — Z8659 Personal history of other mental and behavioral disorders: Secondary | ICD-10-CM

## 2019-03-18 DIAGNOSIS — F5101 Primary insomnia: Secondary | ICD-10-CM

## 2019-03-18 NOTE — Telephone Encounter (Signed)
Last Visit: 01/05/19 Next visit: 04/06/19  Okay to refill Xanax?  

## 2019-03-20 ENCOUNTER — Other Ambulatory Visit: Payer: Self-pay | Admitting: Rheumatology

## 2019-03-20 DIAGNOSIS — F5101 Primary insomnia: Secondary | ICD-10-CM

## 2019-03-20 DIAGNOSIS — Z8659 Personal history of other mental and behavioral disorders: Secondary | ICD-10-CM

## 2019-03-23 NOTE — Progress Notes (Deleted)
Office Visit Note  Patient: [name]             Date of Birth: 12/25/1945           MRN: [id number]             PCP: Donald Prose, MD Referring: Donald Prose, MD Visit Date: 04/06/2019 Occupation: @GUAROCC @  Subjective:  No chief complaint on file.   History of Present Illness: [name] is a 73 y.o. male ***   Activities of Daily Living:  Patient reports morning stiffness for *** {minute/hour:19697}.   Patient {ACTIONS;DENIES/REPORTS:21021675::"Denies"} nocturnal pain.  Difficulty dressing/grooming: {ACTIONS;DENIES/REPORTS:21021675::"Denies"} Difficulty climbing stairs: {ACTIONS;DENIES/REPORTS:21021675::"Denies"} Difficulty getting out of chair: {ACTIONS;DENIES/REPORTS:21021675::"Denies"} Difficulty using hands for taps, buttons, cutlery, and/or writing: {ACTIONS;DENIES/REPORTS:21021675::"Denies"}  No Rheumatology ROS completed.   PMFS History:  Patient Active Problem List   Diagnosis Date Noted  . Rheumatoid arthritis of multiple sites with negative rheumatoid factor ([place]) 09/08/2016  . Primary osteoarthritis of both knees 09/08/2016  . History of left knee replacement 09/08/2016  . Primary osteoarthritis of both hands 09/08/2016  . DJD (degenerative joint disease), cervical 09/08/2016  . Spondylosis of lumbar region without myelopathy or radiculopathy 09/08/2016  . Primary insomnia 09/08/2016  . History of anxiety 09/08/2016  . High risk medication use 08/19/2016  . Atrial flutter with rapid ventricular response ([place]) 06/06/2012  . Substernal chest pain 06/06/2012  . Hypotension 06/06/2012  . Hypertension 06/06/2012    Past Medical History:  Diagnosis Date  . BPH (benign prostatic hyperplasia)   . Dysrhythmia    atrial flutter  . History of pneumonia   . HTN (hypertension)   . Hypertension   . IBS (irritable bowel syndrome)   . PUD (peptic ulcer disease)    Dr. Paulita Fujita  . Rheumatoid arthritis(714.0)    Dr. Estanislado Pandy    Family History   Problem Relation Age of Onset  . CAD Father        MI x 3 (1st 54s)  . Heart failure Father   . Epilepsy Daughter    Past Surgical History:  Procedure Laterality Date  . ABLATION OF DYSRHYTHMIC FOCUS  2013  . APPENDECTOMY    . ATRIAL FLUTTER ABLATION N/A 07/19/2012   Procedure: ATRIAL FLUTTER ABLATION;  Surgeon: Evans Lance, MD;  Location: [facility] CATH LAB;  Service: Cardiovascular;  Laterality: N/A;  . CARDIOVERSION N/A 06/07/2012   Procedure: CARDIOVERSION;  Surgeon: Evans Lance, MD;  Location: [facility] CATH LAB;  Service: Cardiovascular;  Laterality: N/A;  . KNEE ARTHROSCOPY  2011   left  . LEFT HEART CATHETERIZATION WITH CORONARY ANGIOGRAM N/A 06/07/2012   Procedure: LEFT HEART CATHETERIZATION WITH CORONARY ANGIOGRAM;  Surgeon: Peter M Martinique, MD;  Location: [facility] CATH LAB;  Service: Cardiovascular;  Laterality: N/A;  . NECK SURGERY  06/22/2017   melanoma removal   . TOTAL KNEE ARTHROPLASTY  2012   left total knee   Social History   Social History Narrative  . Not on file   Immunization History  Administered Date(s) Administered  . Influenza Inj Mdck Quad Pf 05/07/2018  . Influenza, High Dose Seasonal PF 04/23/2014, 04/26/2015, 04/24/2016  . Influenza-Unspecified 04/29/2012     Objective: Vital Signs: There were no vitals taken for this visit.   Physical Exam   Musculoskeletal Exam: ***  CDAI Exam: CDAI Score: - Patient Global: -; Provider Global: - Swollen: -; Tender: - Joint Exam   No joint exam has been documented for this visit   There is currently no  information documented on the homunculus. Go to the Rheumatology activity and complete the homunculus joint exam.  Investigation: No additional findings.  Imaging: No results found.  Recent Labs: Lab Results  Component Value Date   WBC 4.5 01/05/2019   HGB 14.0 01/05/2019   PLT 273 01/05/2019   NA 140 01/05/2019   K 4.6 01/05/2019   CL 102 01/05/2019   CO2 29 01/05/2019   GLUCOSE 89 01/05/2019   BUN  15 01/05/2019   CREATININE 1.25 (H) 01/05/2019   BILITOT 0.4 01/05/2019   ALKPHOS 63 03/05/2017   AST 25 01/05/2019   ALT 21 01/05/2019   PROT 6.9 01/05/2019   ALBUMIN 4.2 03/05/2017   CALCIUM 9.7 01/05/2019   GFRAA 67 01/05/2019   QFTBGOLDPLUS NEGATIVE 03/04/2018    Speciality Comments: PLQ eye exam: 05/10/2018 normal @ 20/20 Eyewear. Follow up in 12 months.  Procedures:  No procedures performed Allergies: Sulfa antibiotics   Assessment / Plan:     Visit Diagnoses: No diagnosis found.  Orders: No orders of the defined types were placed in this encounter.  No orders of the defined types were placed in this encounter.   Face-to-face time spent with patient was *** minutes. Greater than 50% of time was spent in counseling and coordination of care.  Follow-Up Instructions: No follow-ups on file.   Earnestine Mealing, CMA  Note - This record has been created using Editor, commissioning.  Chart creation errors have been sought, but may not always  have been located. Such creation errors do not reflect on  the standard of medical care.

## 2019-04-01 ENCOUNTER — Telehealth: Payer: Self-pay | Admitting: Rheumatology

## 2019-04-01 MED ORDER — HYDROXYCHLOROQUINE SULFATE 200 MG PO TABS: 200.0000 mg | ORAL_TABLET | Freq: Two times a day (BID) | ORAL | 2 refills | Status: AC

## 2019-04-01 NOTE — Telephone Encounter (Signed)
Patient left a voicemail requesting a return call to discuss the following items:  1.  Dr. Estanislado Pandy put me on a medicine that I had a serious reaction to.  I spoke with Seth Bake about 5 weeks ago who told me she would get back to me and give me an alternative medication.  2.  Patient states he was suppose to get a prescription refill of Plaquenil (which I am now totally out of) called into his pharmacy at [facility] which was not done.  3.  Patient states he received a message today that he has an appointment with Dr. Estanislado Pandy next week which must be a mistake.  Patient states "my agreement with her was that I would see her every 5-6 months or approximately 2 times/year."

## 2019-04-01 NOTE — Telephone Encounter (Signed)
1. Patient advised that in order to start a new medication he would have to come into the office for an appointment. Patient had a reaction to the [place] and was given a prescription for Prednisone and advised to take benadryl to help with reaction.  2. Patient advised we have not received a refill request on his PLQ. Advised patient I will get prescription sent to the pharmacy today.  3. Patient advised the appointment scheduled is to discuss new medication as he had a reaction to the [place]. Patient states he will not be able to keep that appointment and will call back to reschedule the appointment.   Last Visit: 01/05/19 Next Visit: patient will call back to reschedule.  Labs: 01/05/19 Creat 1.25 GFR 58 Eye exam: 05/18/18 WNL   Okay to refill Plaquenil?

## 2019-04-06 ENCOUNTER — Ambulatory Visit: Payer: Medicare Other | Admitting: Rheumatology

## 2019-07-28 ENCOUNTER — Other Ambulatory Visit: Payer: Self-pay | Admitting: Rheumatology

## 2020-06-19 IMAGING — CR DG CHEST 2V
2 series · 2 of 2 positions shown · non-contrast
Comparison: Chest x-ray 06/06/2012.

CLINICAL DATA: 71-year-old male with cough and congestion.
Low-grade fevers for the past 10 days.

EXAM:
CHEST - 2 VIEW

[w chest pa]
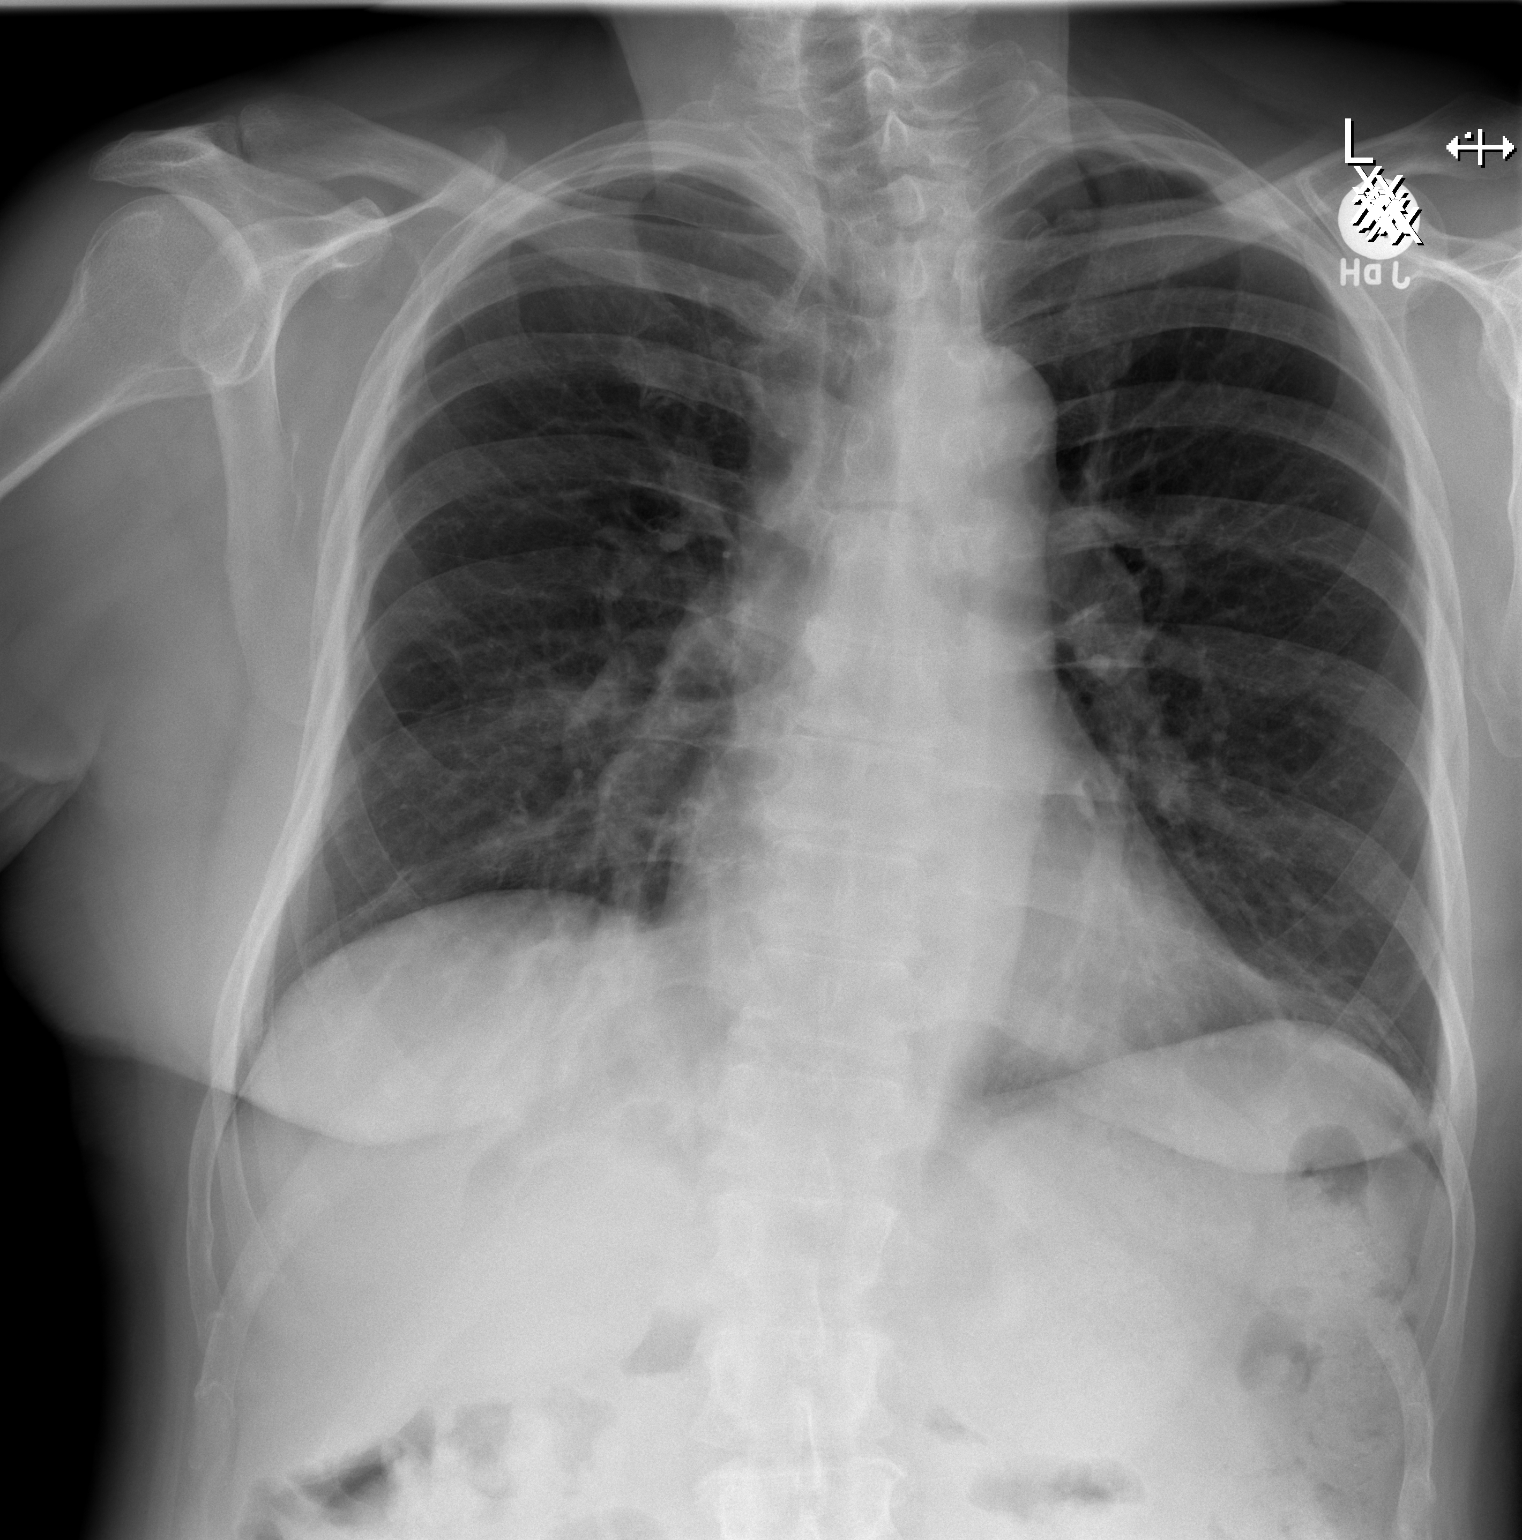

[w chest lat]
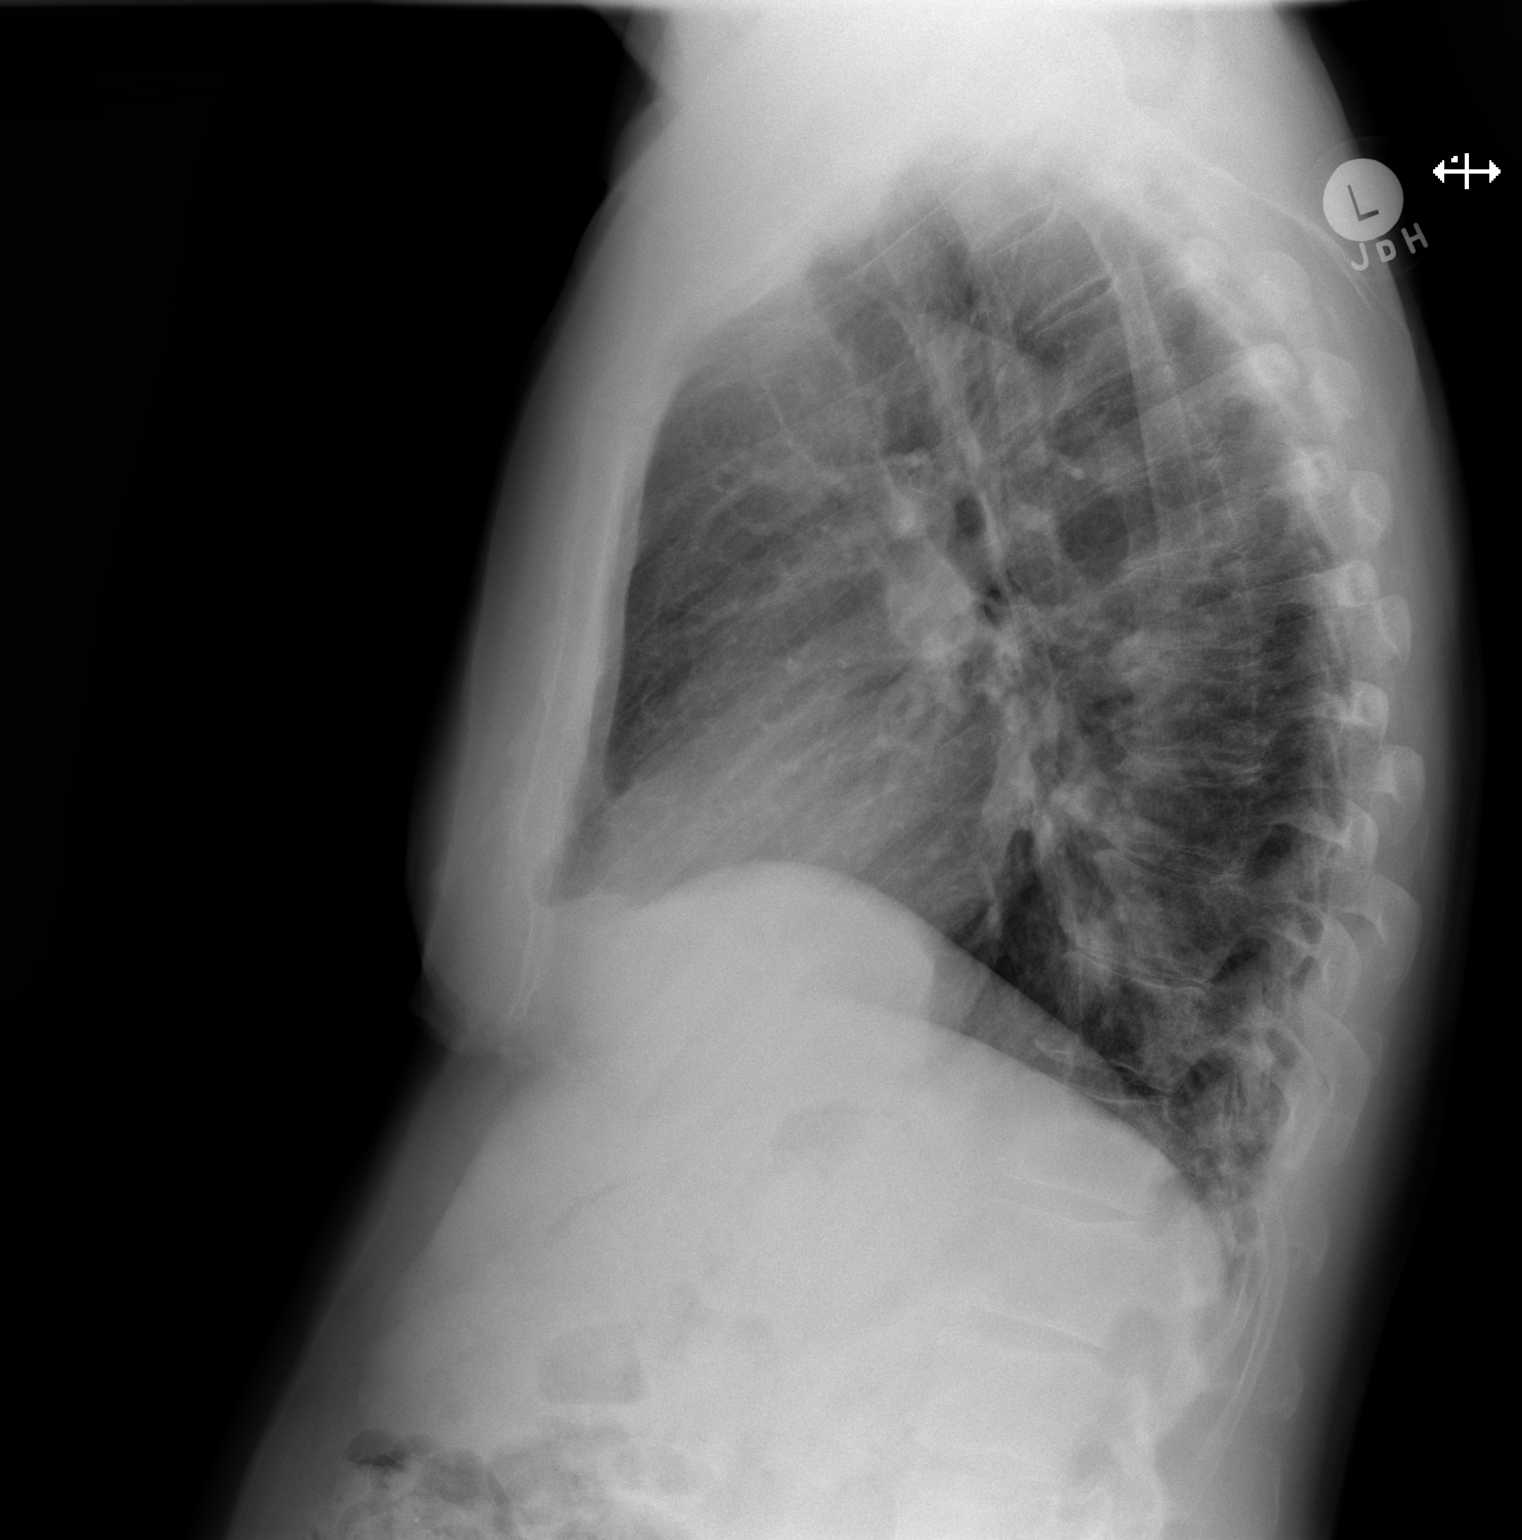

[2 of 2 positions shown; findings below may reference images not displayed]

FINDINGS: Lung volumes are normal. No consolidative airspace disease. No
pleural effusions. No pneumothorax. No pulmonary nodule or mass
noted. Pulmonary vasculature and the cardiomediastinal silhouette
are within normal limits. Aortic atherosclerosis.
IMPRESSION: 1.  No radiographic evidence of acute cardiopulmonary disease.
2. Aortic atherosclerosis.

## 2021-10-19 DEATH — deceased
